# Patient Record
Sex: Female | Born: 1975 | Race: Black or African American | Hispanic: No | Marital: Married | Smoking: Never smoker
Health system: Southern US, Community
[De-identification: ages and names within clinical notes are randomized; demographics above are authoritative.]

## PROBLEM LIST (undated history)

## (undated) ENCOUNTER — Inpatient Hospital Stay (HOSPITAL_COMMUNITY): Payer: Self-pay

## (undated) DIAGNOSIS — Z9884 Bariatric surgery status: Secondary | ICD-10-CM

## (undated) DIAGNOSIS — D649 Anemia, unspecified: Secondary | ICD-10-CM

## (undated) DIAGNOSIS — F419 Anxiety disorder, unspecified: Secondary | ICD-10-CM

## (undated) DIAGNOSIS — B009 Herpesviral infection, unspecified: Secondary | ICD-10-CM

## (undated) DIAGNOSIS — N6009 Solitary cyst of unspecified breast: Secondary | ICD-10-CM

## (undated) DIAGNOSIS — T8859XA Other complications of anesthesia, initial encounter: Secondary | ICD-10-CM

## (undated) DIAGNOSIS — R519 Headache, unspecified: Secondary | ICD-10-CM

## (undated) DIAGNOSIS — I10 Essential (primary) hypertension: Secondary | ICD-10-CM

## (undated) DIAGNOSIS — Z9049 Acquired absence of other specified parts of digestive tract: Secondary | ICD-10-CM

## (undated) HISTORY — DX: Acquired absence of other specified parts of digestive tract: Z90.49

## (undated) HISTORY — PX: BREAST CYST EXCISION: SHX579

## (undated) HISTORY — DX: Essential (primary) hypertension: I10

## (undated) HISTORY — PX: OTHER SURGICAL HISTORY: SHX169

## (undated) HISTORY — DX: Bariatric surgery status: Z98.84

## (undated) HISTORY — DX: Solitary cyst of unspecified breast: N60.09

## (undated) HISTORY — PX: GASTRIC BYPASS: SHX52

## (undated) HISTORY — PX: CHOLECYSTECTOMY: SHX55

## (undated) HISTORY — DX: Herpesviral infection, unspecified: B00.9

## (undated) HISTORY — PX: CYST REMOVAL NECK: SHX6281

---
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
## 1999-12-08 ENCOUNTER — Encounter: Admission: RE | Admit: 1999-12-08 | Discharge: 1999-12-08 | Payer: Self-pay | Admitting: Family Medicine

## 1999-12-08 ENCOUNTER — Encounter: Payer: Self-pay | Admitting: Family Medicine

## 1999-12-21 ENCOUNTER — Ambulatory Visit (HOSPITAL_COMMUNITY): Admission: RE | Admit: 1999-12-21 | Discharge: 1999-12-21 | Payer: Self-pay | Admitting: Obstetrics & Gynecology

## 1999-12-21 ENCOUNTER — Encounter (INDEPENDENT_AMBULATORY_CARE_PROVIDER_SITE_OTHER): Payer: Self-pay

## 2000-12-07 ENCOUNTER — Other Ambulatory Visit: Admission: RE | Admit: 2000-12-07 | Discharge: 2000-12-07 | Payer: Self-pay | Admitting: Obstetrics & Gynecology

## 2002-01-02 ENCOUNTER — Other Ambulatory Visit: Admission: RE | Admit: 2002-01-02 | Discharge: 2002-01-02 | Payer: Self-pay | Admitting: Obstetrics & Gynecology

## 2002-09-01 ENCOUNTER — Other Ambulatory Visit: Admission: RE | Admit: 2002-09-01 | Discharge: 2002-09-01 | Payer: Self-pay | Admitting: Obstetrics & Gynecology

## 2003-02-13 ENCOUNTER — Inpatient Hospital Stay (HOSPITAL_COMMUNITY): Admission: AD | Admit: 2003-02-13 | Discharge: 2003-02-13 | Payer: Self-pay | Admitting: Obstetrics & Gynecology

## 2003-04-09 ENCOUNTER — Ambulatory Visit (HOSPITAL_COMMUNITY): Admission: RE | Admit: 2003-04-09 | Discharge: 2003-04-09 | Payer: Self-pay | Admitting: Obstetrics & Gynecology

## 2003-04-12 ENCOUNTER — Inpatient Hospital Stay (HOSPITAL_COMMUNITY): Admission: AD | Admit: 2003-04-12 | Discharge: 2003-04-15 | Payer: Self-pay | Admitting: Obstetrics & Gynecology

## 2003-04-17 ENCOUNTER — Inpatient Hospital Stay (HOSPITAL_COMMUNITY): Admission: AD | Admit: 2003-04-17 | Discharge: 2003-04-17 | Payer: Self-pay | Admitting: Obstetrics

## 2004-07-05 ENCOUNTER — Emergency Department (HOSPITAL_COMMUNITY): Admission: EM | Admit: 2004-07-05 | Discharge: 2004-07-06 | Payer: Self-pay | Admitting: Emergency Medicine

## 2004-08-21 ENCOUNTER — Emergency Department (HOSPITAL_COMMUNITY): Admission: EM | Admit: 2004-08-21 | Discharge: 2004-08-21 | Payer: Self-pay | Admitting: Emergency Medicine

## 2005-11-06 ENCOUNTER — Encounter: Admission: RE | Admit: 2005-11-06 | Discharge: 2005-11-06 | Payer: Self-pay | Admitting: General Surgery

## 2005-11-06 ENCOUNTER — Ambulatory Visit (HOSPITAL_COMMUNITY): Admission: RE | Admit: 2005-11-06 | Discharge: 2005-11-06 | Payer: Self-pay | Admitting: General Surgery

## 2005-11-07 ENCOUNTER — Ambulatory Visit (HOSPITAL_COMMUNITY): Admission: RE | Admit: 2005-11-07 | Discharge: 2005-11-07 | Payer: Self-pay | Admitting: General Surgery

## 2006-02-16 ENCOUNTER — Encounter: Admission: RE | Admit: 2006-02-16 | Discharge: 2006-05-17 | Payer: Self-pay | Admitting: General Surgery

## 2006-03-01 HISTORY — PX: GASTRIC BYPASS: SHX52

## 2006-03-05 ENCOUNTER — Inpatient Hospital Stay (HOSPITAL_COMMUNITY): Admission: RE | Admit: 2006-03-05 | Discharge: 2006-03-07 | Payer: Self-pay | Admitting: General Surgery

## 2006-05-14 ENCOUNTER — Encounter: Admission: RE | Admit: 2006-05-14 | Discharge: 2006-08-12 | Payer: Self-pay | Admitting: General Surgery

## 2006-08-24 ENCOUNTER — Emergency Department (HOSPITAL_COMMUNITY): Admission: EM | Admit: 2006-08-24 | Discharge: 2006-08-24 | Payer: Self-pay | Admitting: Family Medicine

## 2006-11-03 ENCOUNTER — Emergency Department (HOSPITAL_COMMUNITY): Admission: EM | Admit: 2006-11-03 | Discharge: 2006-11-03 | Payer: Self-pay | Admitting: Family Medicine

## 2007-03-27 ENCOUNTER — Ambulatory Visit (HOSPITAL_COMMUNITY): Admission: RE | Admit: 2007-03-27 | Discharge: 2007-03-27 | Payer: Self-pay | Admitting: Obstetrics & Gynecology

## 2007-05-24 ENCOUNTER — Emergency Department (HOSPITAL_COMMUNITY): Admission: EM | Admit: 2007-05-24 | Discharge: 2007-05-24 | Payer: Self-pay | Admitting: Emergency Medicine

## 2007-12-14 ENCOUNTER — Emergency Department (HOSPITAL_COMMUNITY): Admission: EM | Admit: 2007-12-14 | Discharge: 2007-12-14 | Payer: Self-pay | Admitting: Family Medicine

## 2008-07-07 ENCOUNTER — Emergency Department (HOSPITAL_COMMUNITY): Admission: EM | Admit: 2008-07-07 | Discharge: 2008-07-07 | Payer: Self-pay | Admitting: Emergency Medicine

## 2009-06-28 DIAGNOSIS — O99210 Obesity complicating pregnancy, unspecified trimester: Secondary | ICD-10-CM | POA: Insufficient documentation

## 2010-09-16 NOTE — Op Note (Signed)
Gina Banks, Gina Banks         ACCOUNT NO.:  [id number]   MEDICAL RECORD NO.:  [id number]          PATIENT TYPE:  INP   LOCATION:  0003                         FACILITY:  [facility]   PHYSICIAN:  Sharlet Salina T. Hoxworth, M.D.DATE OF BIRTH:  03-25-1976   DATE OF PROCEDURE:  03/05/2006  DATE OF DISCHARGE:                               OPERATIVE REPORT   PRE AND POSTOPERATIVE DIAGNOSIS:  Morbid obesity.   SURGICAL PROCEDURES:  Laparoscopic Roux-en-Y gastric bypass.   SURGEON:  Lorne Skeens. Hoxworth, M.D.   ASSISTANT:  Dr. Ovidio Kin.   ANESTHESIA:  General.   BRIEF HISTORY:  Gina Banks is a 35 year old female with morbid  obesity unresponsive to medical management presenting with a BMI of 46.  After extensive preoperative discussion and workup detailed elsewhere,  we have elected to proceed with laparoscopic Roux-en-Y gastric bypass.  She is brought to operating room for this procedure.   DESCRIPTION OF PROCEDURE:  Following mechanical antibiotic bowel prep at  home the patient was brought to the operating room and placed supine  position on the operating table and general endotracheal anesthesia was  induced.  She received Lovenox 40 mg subcutaneously.  PAS were placed.  She received broad-spectrum IV antibiotics.  The abdomen was widely  sterilely prepped and draped.  Correct patient procedure were verified.  Abdominal access was obtained through a 1 cm incision in the left  subcostal area with an OptiVu trocar without difficulty.  Pneumoperitoneum established.  Under direct vision 11 mm trocar was  placed in the right subxiphoid space through the falciform ligament in  the right upper mid abdomen, in the left upper abdomen just to the left  of the umbilicus and a 5 mm trocar placed in the left flank.  The camera  switched to the 45 degrees scope.  The omentum was brought up into the  upper abdomen.  The base of the mesocolon identified and the ligament of  Treitz clearly  identified.  A 40-cm biliopancreatic limb was measured  which allowed the mesentery to reach easily up toward the upper stomach.  Small bowel was divided this point with a single firing of the blue load  60 mm stapler.  This also divided the short portion of the mesentery for  mobility.  The end of the Roux limb was marked by suturing a small  Penrose drain to this.  Following this a 100 cm Roux limb was carefully  measured.  At this point a side-to-side anastomosis was created between  the biliopancreatic limb at the end of the side of the Roux limb at 100  cm creating enterotomies with the harmonic scalpel and using a single  firing of blue load 45 mm stapler.  The staple line was inspected and  was intact and without bleeding.  The common enterotomy was closed with  running 200 Vicryl beginning at either end of the enterotomy and tied  centrally.  Following this the defect at the mesentery was closed with a  running 2-0 silk suture and this suture and staple lines were coated  with Tisseel tissue sealant.  Anastomosis appeared widely patent under  no tension.  A Nathanson retractor was placed through a 5 mm subxiphoid  site and the left lobe of liver elevated with its exposure of the entire  stomach.  Angle of His was dissected with harmonic scalpel and  dissection carried down along the left crus toward the retrogastric  space.  A point planned vision along the lesser curve at about 4 cm from  the EG junction was chosen and this was cleared of perigastric mesentery  and with careful blunt dissection the free lesser sac was entered.  All  tubes were confirmed removed from the stomach.  Initial firing the gold  Echelon stapler was performed at right angles to the lesser curve for  about 3.5 to 4 cm.  Two further firings of the blue load stapler were  used to fire up through the angle of his and this created a nice small  tubular gastric pouch about 4 cm in length.  There was no bleeding  from  the staple lines.  Following this, the remnant staple line was oversewn  with running 2-0 silk for hemostasis.  The Roux limb was brought up to  the pouch with the candy cane facing to the left.  Initial posterior row  of running 2-0 Vicryl was placed between the Roux limb and the staple  line of the gastric pouch.  The Penrose drain was removed.  The Ewald  tube was passed into the pouch and using this stent the pouch,  enterotomies were made in the pouch of the Roux limb with the harmonic  scalpel.  After this, approximately 2 cm anastomosis was created between  the pouch and the Roux limb with a single firing the blue load 45 mm  stapler.  Staple line was intact and without bleeding.  The common  enterotomy was then closed with running 2-0 Vicryl beginning either of  the enterotomy and tied centrally.  The Ewald tube was then passed back  down through the anastomosis.  An anterior row of running 2-0 Vicryl was  then placed.  Following this with the outlet of the pouch clamped, Dr.  Ezzard Standing performed endoscopy with pouch tightly distended with air.  There  was no evidence of leak.  Air was suctioned.  Abdomen was irrigated.  Hemostasis assured.  Tisseel tissue sealant was used to coat the sutures  and staple lines of the gastrojejunostomy.  The Nathanson retractor was  removed under direct vision, all CO2 evacuated and trocars removed.  Skin incisions were closed with staples.   Sponge, needle and instrument counts were correct.  Dry sterile  dressings were applied.  The patient taken recovery in good condition.      Lorne Skeens. Hoxworth, M.D.  Electronically Signed     BTH/MEDQ  D:  03/05/2006  T:  03/05/2006  Job:  [id number]

## 2010-09-16 NOTE — Op Note (Signed)
NAMENAYELLIE, SANSEVERINO         ACCOUNT NO.:  [id number]   MEDICAL RECORD NO.:  [id number]          PATIENT TYPE:  INP   LOCATION:  0003                         FACILITY:  [facility]   PHYSICIAN:  Sandria Bales. Ezzard Standing, M.D.  DATE OF BIRTH:  January 17, 1976   DATE OF PROCEDURE:  DATE OF DISCHARGE:                                 OPERATIVE REPORT   PREOPERATIVE DIAGNOSIS:  Morbid obesity with a BMI about 45.   POSTOPERATIVE DIAGNOSIS:  Morbid obesity, status post roux-en-Y gastric  bypass.   PROCEDURE PERFORMED:  Esophagogastrojejunoscopy.   ASSISTANT:  None.   ANESTHESIA:  General endotracheal.   ESTIMATED BLOOD LOSS:  None.   INDICATIONS FOR PROCEDURE:  Ms. Wolven is a 35 year old female who has  undergone a laparoscopic Roux-en-Y gastric bypass by Dr. Jaclynn Guarneri.  While he is manning the laparoscope, I am doing upper endoscopy for  documentation of pouch size and making sure there is no leak.   PROCEDURE NOTE:  The patient is in a mildly reverse Trendelenburg position.  A flexible  Olympus endoscope was passed down the back of the throat into her stomach  pouch without difficulty.  Her gastrojejunal anastomosis was visualized at  45 cm.  Her esophagogastric junction was visualized at 40 cm for about a 4.5  cm pouch. The mucosa of the pouch looked good.  There was no bleeding.  Her  anastomosis was widely patent.   Dr. Johna Sheriff has clamped off the jejunum while in insufflated the stomach  with air and flooded the upper abdomen.  There is no evidence of any  bubbling or air leak.   This is felt to be a normal post-anastomosis endoscope.  The scope was  withdrawn into the esophagus which is unremarkable.  The patient tolerated  the procedure well.  Dr. Johna Sheriff will dictate the roux-en-Y gastric bypass.      Sandria Bales. Ezzard Standing, M.D.  Electronically Signed     DHN/MEDQ  D:  03/05/2006  T:  03/05/2006  Job:  [id number]   cc:   Lorne Skeens. Hoxworth, M.D.  [address],  [address]  [place]  [place] [address]

## 2010-09-16 NOTE — Op Note (Signed)
[facility]  Patient:    Gina, Banks                MRN: [id number] Proc. Date: 12/21/99 Adm. Date:  [id number] Attending:  Minette Headland                           Operative Report  PREOPERATIVE DIAGNOSES:       Menorrhagia, cyclic pelvic pain.  POSTOPERATIVE DIAGNOSES:      Normal uterine cavity except for size of uterus, uterine enlargement most consistent with adenomyosis, no other pelvic abnormalities or obvious intra-abdominal abnormalities.  CLINICAL FINDINGS:            Slight enlargement of uterus with tenderness of the uterus, suspected adenomyosis and/or endometriosis.  OPERATION PERFORMED:          Hysteroscopy, dilation and curettage performed by laparoscopy with uterosacral nerve oblation.  ESTIMATED BLOOD LOSS:         20 cc.  SORBITOL DEFICIT FROM HYSTEROSCOPY:                 50 cc.  INTRAOPERATIVE COMPLICATIONS: None.  INTRAOPERATIVE FINDINGS:      Recorded in still photographs which are retained in the office record.  HISTORY OF PRESENT ILLNESS:   The patient is a 35 year old black single female nulligravida who was kindly referred by her general medical doctor for GYN evaluation following her presentation to Dr. Joyce Gross office in early August of this year with abdominal pain. Pelvic ultrasound obtained by Dr. Lisabeth Pick showed an increased echogenicity anteriorly within the myometrium of the mid uterus which was consistent with adenomyosis. The ovaries and other pelvic findings were normal.  Intraoperative findings today were uterine enlargement. The uterus sounded to 10 cm. There were no intracavitary abnormalities. At laparoscopy, the uterus was enlarged and slightly boggy in appearance and slightly irregular, however, the general contour was normal. The uterus was enlarged the 1.5 to 2 times normal. No other abnormalities were noted within the pelvis or abdomen including the appendix.  DESCRIPTION OF  PROCEDURE:     The patient was admitted on the morning of surgery. She was given a gram of Cefotan IV preoperatively. She was placed in PAS hose. She was brought to the operating room, placed under adequate general endotracheal anesthesia, placed in dorsal lithotomy position using the Orange stirrup system. Betadine prep of the abdomen, perineum and vagina was carried out in the usual fashion. The bladder was evacuated with a Robinson catheter. The cervix was visualized using a bivalve speculum. The cervix was grasped over the anterior lip with a single tooth tenaculum. The uterus sounded to 10 cm.  Progressive dilatation of the cervix was carried out with Shawnie Pons dilators to 23. The ACMI 12 degree hysteroscope was introduced using sorbitol as a distending medium and examination revealed an enlargement, otherwise normal endometrial cavity. Photos were made and retained in the records. Gentle cervical curettage and exploration with Randall stone forceps was accomplished to sample the endometrium. The Hulka tenaculum was then attached to the cervix. Sterile drapes were applied to the abdomen. Two incisions were made, 1 at the umbilicus and 1 just above the symphysis. A 10 mm trocar was introduced to the upper incision while elevating the anterior abdominal wall manually. Direct inspection revealed the trocar to have passed through the omentum but no other structures were injured and there was no bleeding from the omentum. A 5 mm trocar was  placed in the lower incision under direct vision after a pneumoperitoneum was allowed to accumulated with carbon dioxide gas. Systematic examination of abdominal and pelvic contents plus photographs were taken. Using the bipolar forceps through the operating channel of the laparoscope, the uterosacrals were fulgurated at their junction with the cervix. The procedure at this point was terminated. Gas was allowed to escape from the abdomen. Hemostasis was  complete. The incisions were closed in interrupted subcuticular sutures of 3-0 Dexon. Approximately 10 cc of 0.5% plain Marcaine was used to inject into the incision sites for postoperative analgesia. The patient was awakened and taken to recovery in good condition. She will be discharged in the immediate postop period for follow-up in the office in 2 weeks. She is given Vicodin to be taken as needed for postoperative pain. She will be given routine outpatient surgical instructions. DD:  12/21/99 TD:  12/21/99 Job: [id number]

## 2011-01-19 LAB — DIFFERENTIAL
Basophils Absolute: 0.1
Lymphocytes Relative: 40
Monocytes Relative: 16 — ABNORMAL HIGH
Neutrophils Relative %: 39 — ABNORMAL LOW

## 2011-01-19 LAB — CBC
Hemoglobin: 11 — ABNORMAL LOW
Platelets: 256
RBC: 3.89
RDW: 14.2
WBC: 5.4

## 2011-01-19 LAB — COMPREHENSIVE METABOLIC PANEL
BUN: 4 — ABNORMAL LOW
CO2: 27
Calcium: 9.4
Chloride: 108
Creatinine, Ser: 0.79
Glucose, Bld: 91
Potassium: 4.1

## 2011-02-14 LAB — WET PREP, GENITAL: Trich, Wet Prep: NONE SEEN

## 2011-02-14 LAB — POCT URINALYSIS DIP (DEVICE)
Glucose, UA: NEGATIVE
Nitrite: NEGATIVE
Operator id: 270961
Specific Gravity, Urine: 1.02
pH: 6

## 2011-02-14 LAB — GC/CHLAMYDIA PROBE AMP, GENITAL
Chlamydia, DNA Probe: NEGATIVE
GC Probe Amp, Genital: NEGATIVE

## 2011-02-14 LAB — POCT PREGNANCY, URINE: Preg Test, Ur: NEGATIVE

## 2011-05-16 ENCOUNTER — Emergency Department (INDEPENDENT_AMBULATORY_CARE_PROVIDER_SITE_OTHER)
Admission: EM | Admit: 2011-05-16 | Discharge: 2011-05-16 | Disposition: A | Discharge status: Home / Self Care | Payer: BC Managed Care – PPO | Source: Home / Self Care | Attending: Family Medicine | Admitting: Family Medicine

## 2011-05-16 ENCOUNTER — Encounter (HOSPITAL_COMMUNITY): Payer: Self-pay | Admitting: Emergency Medicine

## 2011-05-16 DIAGNOSIS — I10 Essential (primary) hypertension: Secondary | ICD-10-CM

## 2011-05-16 MED ORDER — HYDROCHLOROTHIAZIDE 25 MG PO TABS: 25.0000 mg | ORAL_TABLET | Freq: Every day | ORAL | Status: DC
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
## 2011-05-16 NOTE — ED Provider Notes (Signed)
Medical screening examination/treatment/procedure(s) were performed by non-physician practitioner and as supervising physician I was immediately available for consultation/collaboration.  Corrie Mckusick, MD 05/16/11 2119

## 2012-09-02 ENCOUNTER — Ambulatory Visit (INDEPENDENT_AMBULATORY_CARE_PROVIDER_SITE_OTHER): Payer: BC Managed Care – PPO | Admitting: *Deleted

## 2012-09-02 VITALS — BP 123/87 | HR 80 | Temp 98.4°F | Ht 68.0 in | Wt 260.0 lb

## 2012-09-02 DIAGNOSIS — Z309 Encounter for contraceptive management, unspecified: Secondary | ICD-10-CM

## 2012-09-02 DIAGNOSIS — IMO0001 Reserved for inherently not codable concepts without codable children: Secondary | ICD-10-CM

## 2012-09-02 MED ORDER — MEDROXYPROGESTERONE ACETATE 150 MG/ML IM SUSP
150.0000 mg | INTRAMUSCULAR | Status: AC
  Administered 2012-09-02 – 2013-05-02 (×3): 150 mg via INTRAMUSCULAR

## 2012-09-02 NOTE — Progress Notes (Signed)
Patient was here today for her depo injection.  Medroxyprogesterone 150mg /ml 1ml IM to her right Deltoid.  [id number]  Exp- 05/2015.  Patient tolerated well.  RTO for next visit. 11/24/12.

## 2012-11-18 ENCOUNTER — Encounter: Payer: Self-pay | Admitting: Obstetrics & Gynecology

## 2012-11-19 ENCOUNTER — Ambulatory Visit (INDEPENDENT_AMBULATORY_CARE_PROVIDER_SITE_OTHER): Payer: BC Managed Care – PPO | Admitting: *Deleted

## 2012-11-19 VITALS — BP 123/82 | HR 99 | Wt 267.0 lb

## 2012-11-19 DIAGNOSIS — IMO0001 Reserved for inherently not codable concepts without codable children: Secondary | ICD-10-CM

## 2012-11-19 DIAGNOSIS — Z3049 Encounter for surveillance of other contraceptives: Secondary | ICD-10-CM

## 2012-11-19 NOTE — Progress Notes (Signed)
Pt in office for Depo injection. Pt is on time for her injection. Pt due back in the office for next injection is February 08, 2013.

## 2013-02-10 ENCOUNTER — Ambulatory Visit: Payer: BC Managed Care – PPO

## 2013-02-12 ENCOUNTER — Ambulatory Visit (INDEPENDENT_AMBULATORY_CARE_PROVIDER_SITE_OTHER): Payer: BC Managed Care – PPO | Admitting: *Deleted

## 2013-02-12 VITALS — BP 127/92 | HR 82 | Temp 98.2°F | Ht 67.5 in | Wt 269.6 lb

## 2013-02-12 DIAGNOSIS — IMO0001 Reserved for inherently not codable concepts without codable children: Secondary | ICD-10-CM

## 2013-02-12 DIAGNOSIS — Z309 Encounter for contraceptive management, unspecified: Secondary | ICD-10-CM

## 2013-02-12 NOTE — Progress Notes (Signed)
Patient is here today for her depo injection.  Patient tolerated well.  RTO 05/06/12 for next visit.

## 2013-03-31 ENCOUNTER — Ambulatory Visit (INDEPENDENT_AMBULATORY_CARE_PROVIDER_SITE_OTHER): Payer: BC Managed Care – PPO | Admitting: Podiatry

## 2013-03-31 ENCOUNTER — Encounter: Payer: Self-pay | Admitting: Podiatry

## 2013-03-31 VITALS — BP 126/78 | HR 81 | Resp 20 | Ht 67.5 in | Wt 260.0 lb

## 2013-03-31 DIAGNOSIS — Q828 Other specified congenital malformations of skin: Secondary | ICD-10-CM

## 2013-03-31 NOTE — Progress Notes (Signed)
Patient ID: Gina Banks, female   DOB: 03/01/1976, 37 y.o.   MRN: [id number]  Subjective: Patient presents complaining of painful keratoses on the plantar right heel. The lesion on the MPJ right seems to have cleared from previous debridement. She has been a patient of practice since April 2013 at last visit in our office was 06/12/2012.  Objective: Nucleated keratoses plantar right heel x1  Assessment: Porokeratoses x1, right heel  Plan: Debridement of keratoses and packed with salinocaine. Reappoint at patient's request.

## 2013-05-02 ENCOUNTER — Ambulatory Visit (INDEPENDENT_AMBULATORY_CARE_PROVIDER_SITE_OTHER): Payer: BC Managed Care – PPO | Admitting: *Deleted

## 2013-05-02 VITALS — BP 115/86 | HR 130 | Temp 98.1°F | Ht 67.5 in | Wt 264.0 lb

## 2013-05-02 DIAGNOSIS — Z309 Encounter for contraceptive management, unspecified: Secondary | ICD-10-CM

## 2013-05-02 DIAGNOSIS — IMO0001 Reserved for inherently not codable concepts without codable children: Secondary | ICD-10-CM

## 2013-05-02 NOTE — Progress Notes (Signed)
Pt in office today for depo injection 

## 2013-05-06 ENCOUNTER — Ambulatory Visit: Payer: BC Managed Care – PPO

## 2013-05-29 ENCOUNTER — Encounter: Payer: Self-pay | Admitting: Obstetrics & Gynecology

## 2013-05-29 ENCOUNTER — Ambulatory Visit (INDEPENDENT_AMBULATORY_CARE_PROVIDER_SITE_OTHER): Payer: BC Managed Care – PPO | Admitting: Obstetrics & Gynecology

## 2013-05-29 VITALS — BP 136/85 | HR 101 | Temp 100.0°F | Ht 67.5 in

## 2013-05-29 DIAGNOSIS — Z3169 Encounter for other general counseling and advice on procreation: Secondary | ICD-10-CM

## 2013-05-29 DIAGNOSIS — Z01419 Encounter for gynecological examination (general) (routine) without abnormal findings: Secondary | ICD-10-CM

## 2013-05-29 NOTE — Progress Notes (Signed)
Subjective:     Gina Banks is a 38 y.o. female here for a routine exam.  Current complaints: pt in office to discuss conception and possible exam.  Personal health questionnaire reviewed: yes.   Gynecologic History No LMP recorded. Patient has had an injection. Contraception: Depo-Provera injections  Last mammogram: n/a  Obstetric History OB History  No data available     The following portions of the patient's history were reviewed and updated as appropriate: allergies, current medications, past family history, past medical history, past social history, past surgical history and problem list.  Review of Systems Pertinent items are noted in HPI.    Objective:      General appearance: alert Breasts: normal appearance, no masses or tenderness Abdomen: soft, non-tender; bowel sounds normal; no masses,  no organomegaly Pelvic: cervix normal in appearance, external genitalia normal, no adnexal masses or tenderness, uterus normal size, shape, and consistency and vagina normal without discharge    Assessment:    Healthy female exam.    Plan:    Follow up in: 6 months.   Family Practice Referral  Preconception information given Basal Temp charts given

## 2013-05-30 LAB — HIV ANTIBODY (ROUTINE TESTING W REFLEX): HIV: NONREACTIVE

## 2013-05-30 LAB — VITAMIN D 25 HYDROXY (VIT D DEFICIENCY, FRACTURES): Vit D, 25-Hydroxy: 18 ng/mL — ABNORMAL LOW (ref 30–89)

## 2013-05-31 LAB — OBSTETRIC PANEL
ANTIBODY SCREEN: NEGATIVE
Basophils Absolute: 0.1 10*3/uL (ref 0.0–0.1)
Basophils Relative: 1 % (ref 0–1)
Eosinophils Absolute: 0.1 10*3/uL (ref 0.0–0.7)
Eosinophils Relative: 2 % (ref 0–5)
HCT: 37.6 % (ref 36.0–46.0)
Hemoglobin: 11.8 g/dL — ABNORMAL LOW (ref 12.0–15.0)
Hepatitis B Surface Ag: NEGATIVE
LYMPHS ABS: 2.4 10*3/uL (ref 0.7–4.0)
Lymphocytes Relative: 50 % — ABNORMAL HIGH (ref 12–46)
MCH: 26.8 pg (ref 26.0–34.0)
MCHC: 31.4 g/dL (ref 30.0–36.0)
MCV: 85.5 fL (ref 78.0–100.0)
MONOS PCT: 10 % (ref 3–12)
Monocytes Absolute: 0.5 10*3/uL (ref 0.1–1.0)
NEUTROS PCT: 37 % — AB (ref 43–77)
Neutro Abs: 1.9 10*3/uL (ref 1.7–7.7)
PLATELETS: 331 10*3/uL (ref 150–400)
RBC: 4.4 MIL/uL (ref 3.87–5.11)
RDW: 14.3 % (ref 11.5–15.5)
RH TYPE: POSITIVE
RUBELLA: 10.2 {index} — AB (ref <0.90)
WBC: 4.9 10*3/uL (ref 4.0–10.5)

## 2013-05-31 LAB — VARICELLA ZOSTER ANTIBODY, IGG: VARICELLA IGG: 1797 {index} — AB (ref <135.00)

## 2013-06-01 NOTE — Patient Instructions (Signed)
Preparing for Pregnancy Preparing for pregnancy (preconceptual care) by getting counseling and information from your caregiver before getting pregnant is a good idea. It will help you and your baby have a better chance to have a healthy, safe pregnancy and delivery of your baby. Make an appointment with your caregiver to talk about your health, medical, and family history and how to prepare yourself before getting pregnant. Your caregiver will do a complete physical exam and a Pap test. They will want to know:  About you, your spouse or partner, and your family's medical and genetic history.  If you are eating a balanced diet and drinking enough fluids.  What vitamins and mineral supplements you are taking. This includes taking folic acid before getting pregnant to help prevent birth defects.  What medications you are taking including prescription, over-the-counter and herbal medications.  If there is any substance abuse like alcohol, smoking, and illegal drugs.  If there is any mental or physical domestic violence.  If there is any risk of sexually transmitted disease between you and your partner.  What immunizations and vaccinations you have had and what you may need before getting pregnant.  If you should get tested for HIV infection.  If there is any exposure to chemical or toxic substances at home or work.  If there are medical problems you have that need to be treated and kept under control before getting pregnant such as diabetes, high blood pressure or others.  If there were any past surgeries, pregnancies and problems with them.  What your current weight is and to set a goal as to how much weight you should gain while pregnant. Also, they will check if you should lose or gain weight before getting pregnant.  What is your exercise routine and what it is safe when you are pregnant.  If there are any physical disabilities that need to be addressed.  About spacing your  pregnancies when there are other children.  If there is a financial problem that may affect you having a child. After talking about the above points with your caregiver, your caregiver will give you advice on how to help treat and work with you on solving any issues, if necessary, before getting pregnant. The goal is to have a healthy and safe pregnancy for you and your baby. You should keep an accurate record of your menstrual periods because it will help in determining your due date. Immunizations that you should have before getting pregnant:   Regular measles, German measles (rubella) and mumps.  Tetanus and diphtheria.  Chickenpox, if not immune.  Herpes zoster (Varicella) if not immune.  Human papilloma virus vaccine (HPV) between the age of 9 and 26 years old.  Hepatitis A vaccine.  Hepatitis B vaccine.  Influenza vaccine.  Pneumococcal vaccine (pneumonia). You should avoid getting pregnant for one month after getting vaccinated with a live virus vaccine such as German measles (rubella) which is in the MMR (Measles, Mumps and Rubella) vaccine. Other immunizations may be necessary depending on where you live, such as malaria. Ask your caregiver if any other immunizations are needed for you. HOME CARE INSTRUCTIONS   Follow the advice of your caregiver.  Before getting pregnant:  Begin taking vitamins, supplements, and 0.4 milligrams folic acid daily.  Get your immunizations up-to-date.  Get help from a nutrition counselor if you do not understand what a balanced diet is, need help with a special medical diet or if you need help to lose or gain weight.    Begin exercising.  Stop smoking, taking illegal drugs, and drinking alcoholic beverages.  Get counseling if there is and type of domestic violence.  Get checked for sexually transmitted diseases including HIV.  Get any medical problems under control (diabetes, high blood pressure, convulsions, asthma or  others).  Resolve any financial concerns or create a plan to do so.  Be sure you and your spouse or partner are ready to have a baby.  Keep an accurate record of your menstrual periods. Document Released: 03/30/2008 Document Revised: 02/05/2013 Document Reviewed: 03/30/2008 [facility] Patient Information 2014 [place].

## 2013-06-02 LAB — HEMOGLOBINOPATHY EVALUATION
HEMOGLOBIN OTHER: 0 %
HGB S QUANTITAION: 0 %
Hgb A2 Quant: 2.6 % (ref 2.2–3.2)
Hgb A: 97.1 % (ref 96.8–97.8)
Hgb F Quant: 0.3 % (ref 0.0–2.0)

## 2013-06-02 LAB — PAP IG AND HPV HIGH-RISK: HPV DNA HIGH RISK: NOT DETECTED

## 2013-06-07 ENCOUNTER — Encounter: Payer: Self-pay | Admitting: Obstetrics & Gynecology

## 2013-06-07 DIAGNOSIS — E559 Vitamin D deficiency, unspecified: Secondary | ICD-10-CM | POA: Insufficient documentation

## 2013-06-30 ENCOUNTER — Ambulatory Visit: Payer: BC Managed Care – PPO

## 2013-07-09 ENCOUNTER — Other Ambulatory Visit: Payer: Self-pay | Admitting: *Deleted

## 2013-07-09 ENCOUNTER — Encounter: Payer: Self-pay | Admitting: Obstetrics & Gynecology

## 2013-07-09 DIAGNOSIS — E559 Vitamin D deficiency, unspecified: Secondary | ICD-10-CM

## 2013-07-09 MED ORDER — ERGOCALCIFEROL 1.25 MG (50000 UT) PO CAPS: 50000.0000 [IU] | ORAL_CAPSULE | ORAL | Status: DC

## 2013-11-26 ENCOUNTER — Encounter: Payer: Self-pay | Admitting: Obstetrics & Gynecology

## 2013-11-26 ENCOUNTER — Ambulatory Visit (INDEPENDENT_AMBULATORY_CARE_PROVIDER_SITE_OTHER): Payer: BC Managed Care – PPO | Admitting: Obstetrics & Gynecology

## 2013-11-26 VITALS — BP 140/80 | HR 82 | Temp 97.9°F | Ht 67.5 in | Wt 270.0 lb

## 2013-11-26 DIAGNOSIS — Z3169 Encounter for other general counseling and advice on procreation: Secondary | ICD-10-CM

## 2013-11-29 LAB — ANTI MULLERIAN HORMONE: AMH AssessR: 4.44 ng/mL

## 2013-12-02 NOTE — Progress Notes (Signed)
Patient ID: Gina ChangKimberly T Comrie, female   DOB: 10-03-1975, 38 y.o.   MRN: [id number]  Chief Complaint  Patient presents with  . Follow-up    Presents for preconception counseling     HPI Gina Banks is a 38 y.o. female.  Reports regular menses.  HPI  Past Medical History  Diagnosis Date  . Menopause present   . Gastric bypass status for obesity   . Cyst of breast   . Hx of cholecystectomy   . Hypertension     Past Surgical History  Procedure Laterality Date  . Cholecystectomy    . Gastric bypass    . Breast cyst excision    . Fallopian tube cyst    . Cyst removal neck Right     Family History  Problem Relation Age of Onset  . Heart attack Paternal Grandfather   . Diabetes Father   . Hypertension Father   . Cancer Mother     Social History History  Substance Use Topics  . Smoking status: Never Smoker   . Smokeless tobacco: Never Used  . Alcohol Use: 2.5 oz/week    5 drink(s) per week    No Known Allergies  Current Outpatient Prescriptions  Medication Sig Dispense Refill  . ergocalciferol (VITAMIN D2) 50000 UNITS capsule Take 1 capsule (50,000 Units total) by mouth once a week.  4 capsule  1   No current facility-administered medications for this visit.    Review of Systems Review of Systems Constitutional: negative for fatigue and weight loss Respiratory: negative for cough and wheezing Cardiovascular: negative for chest pain, fatigue and palpitations Gastrointestinal: negative for abdominal pain and change in bowel habits Genitourinary:negative for abnormal vaginal discharge Integument/breast: negative for nipple discharge Musculoskeletal:negative for myalgias Neurological: negative for gait problems and tremors Behavioral/Psych: negative for abusive relationship, depression Endocrine: negative for temperature intolerance     Blood pressure 140/80, pulse 82, temperature 97.9 F (36.6 C), height 5' 7.5" (1.715 m), weight 122.471 kg (270  lb), last menstrual period 11/13/2013.  Physical Exam Physical Exam   50% of 15 min visit spent on counseling and coordination of care.   Data Reviewed None  Assessment    Attempting to conceive in the setting of recent Depo provera    Plan    Orders Placed This Encounter  Procedures  . Anti mullerian hormone  . POCT urine pregnancy    Possible management options include: continued efforts to conceive and loose weight;  folic acid supplement Follow up as needed.         JACKSON-MOORE,Dawnn Nam A 12/02/2013, 12:16 PM

## 2013-12-02 NOTE — Patient Instructions (Signed)
Preparing for Pregnancy Before trying to become pregnant, make an appointment with your health care provider (preconception care). The goal is to help you have a healthy, safe pregnancy. At your first appointment, your health care provider will:   Do a complete physical exam, including a Pap test.  Take a complete medical history.  Give you advice and help you resolve any problems. PRECONCEPTION CHECKLIST Here is a list of the basics to cover with your health care provider at your preconception visit:  Medical history.  Tell your health care provider about any diseases you have had. Many diseases can affect your pregnancy.  Include your partner's medical history and family history.  Make sure you have been tested for sexually transmitted infections (STIs). These can affect your pregnancy. In some cases, they can be passed to your baby. Tell your health care provider about any history of STIs.  Make sure your health care provider knows about any previous problems you have had with conception or pregnancy.  Tell your health care provider about any medicine you take. This includes herbal supplements and over-the-counter medicines.  Make sure all your immunizations are up to date. You may need to make additional appointments.  Ask your health care provider if you need any vaccinations or if there are any you should avoid.  Diet.  It is especially important to eat a healthy, balanced diet with the right nutrients when you are pregnant.  Ask your health care provider to help you get to a healthy weight before pregnancy.  If you are overweight, you are at higher risk for certain complications. These include high blood pressure, diabetes, and preterm birth.  If you are underweight, you are more likely to have a low-birth-weight baby.  Lifestyle.  Tell your health care provider about lifestyle factors such as alcohol use, drug use, or smoking.  Describe any harmful substances you may  be exposed to at work or home. These can include chemicals, pesticides, and radiation.  Mental health.  Let your health care provider know if you have been feeling depressed or anxious.  Let your health care provider know if you have a history of substance abuse.  Let your health care provider know if you do not feel safe at home. HOME INSTRUCTIONS TO PREPARE FOR PREGNANCY Follow your health care provider's advice and instructions.   Keep an accurate record of your menstrual periods. This makes it easier for your health care provider to determine your baby's due date.  Begin taking prenatal vitamins and folic acid supplements daily. Take them as directed by your health care provider.  Eat a balanced diet. Get help from a nutrition counselor if you have questions or need help.  Get regular exercise. Try to be active for at least 30 minutes a day most days of the week.  Quit smoking, if you smoke.  Do not drink alcohol.  Do not take illegal drugs.  Get medical problems, such as diabetes or high blood pressure, under control.  If you have diabetes, make sure you do the following:  Have good blood sugar control. If you have type 1 diabetes, use multiple daily doses of insulin. Do not use split-dose or premixed insulin.  Have an eye exam by a qualified eye care professional trained in caring for people with diabetes.  Get evaluated by your health care provider for cardiovascular disease.  Get to a healthy weight. If you are overweight or obese, reduce your weight with the help of a qualified health   professional such as a registered dietitian. Ask your health care provider what the right weight range is for you. HOW DO I KNOW I AM PREGNANT? You may be pregnant if you have been sexually active and you miss your period. Symptoms of early pregnancy include:   Mild cramping.  Very light vaginal bleeding (spotting).  Feeling unusually tired.  Morning sickness. If you have any of  these symptoms, take a home pregnancy test. These tests look for a hormone called human chorionic gonadotropin (hCG) in your urine. Your body begins to make this hormone during early pregnancy. These tests are very accurate. Wait until at least the first day you miss your period to take one. If you get a positive result, call your health care provider to make appointments for prenatal care. WHAT SHOULD I DO IF I BECOME PREGNANT?  Make an appointment with your health care provider by week 12 of your pregnancy at the latest.  Do not smoke. Smoking can be harmful to your baby.  Do not drink alcoholic beverages. Alcohol is related to a number of birth defects.  Avoid toxic odors and chemicals.  You may continue to have sexual intercourse if it does not cause pain or other problems, such as vaginal bleeding. Document Released: 03/30/2008 Document Revised: 09/01/2013 Document Reviewed: 03/24/2013 Exit[employer] Patient Information 2015 [employer]. This information is not intended to replace advice given to you by your health care provider. Make sure you discuss any questions you have with your health care provider.  

## 2014-01-16 ENCOUNTER — Telehealth: Payer: Self-pay

## 2014-01-16 NOTE — Telephone Encounter (Signed)
[employer] NOT ACTIVE - CALLED PATIENT 01/16/14 - DID NOT VERIFY ON BLUE E, EITHER - WILL NEED UPDATED INS INFO BEFORE APPT OR WILL NEED TO RESCHEDULE.

## 2014-01-22 ENCOUNTER — Other Ambulatory Visit: Payer: Self-pay | Admitting: Obstetrics & Gynecology

## 2014-01-22 ENCOUNTER — Ambulatory Visit (HOSPITAL_COMMUNITY)
Admission: RE | Admit: 2014-01-22 | Discharge: 2014-01-22 | Disposition: A | Discharge status: Home / Self Care | Payer: Self-pay | Source: Ambulatory Visit | Attending: Obstetrics & Gynecology | Admitting: Obstetrics & Gynecology

## 2014-01-22 ENCOUNTER — Ambulatory Visit: Payer: BC Managed Care – PPO | Admitting: Obstetrics & Gynecology

## 2014-01-22 ENCOUNTER — Encounter: Payer: Self-pay | Admitting: Obstetrics & Gynecology

## 2014-01-22 VITALS — BP 132/85 | HR 90 | Temp 98.2°F | Wt 264.0 lb

## 2014-01-22 DIAGNOSIS — Z3481 Encounter for supervision of other normal pregnancy, first trimester: Secondary | ICD-10-CM

## 2014-01-22 DIAGNOSIS — O3680X0 Pregnancy with inconclusive fetal viability, not applicable or unspecified: Secondary | ICD-10-CM | POA: Insufficient documentation

## 2014-01-22 DIAGNOSIS — O09521 Supervision of elderly multigravida, first trimester: Secondary | ICD-10-CM

## 2014-01-22 DIAGNOSIS — O99211 Obesity complicating pregnancy, first trimester: Secondary | ICD-10-CM

## 2014-01-22 DIAGNOSIS — O3680X1 Pregnancy with inconclusive fetal viability, fetus 1: Secondary | ICD-10-CM

## 2014-01-22 DIAGNOSIS — O09529 Supervision of elderly multigravida, unspecified trimester: Secondary | ICD-10-CM | POA: Insufficient documentation

## 2014-01-22 DIAGNOSIS — Z3689 Encounter for other specified antenatal screening: Secondary | ICD-10-CM | POA: Insufficient documentation

## 2014-01-22 NOTE — Progress Notes (Unsigned)
Subjective:    Gina Banks is being seen today for her first obstetrical visit.  This {is/is not:9024} a planned pregnancy. She is at [redacted]w[redacted]d gestation. Her obstetrical history is significant for {ob risk factors:10154}. Relationship with FOB: {fob:16621}. Patient {does/does not:19097} intend to breast feed. Pregnancy history fully reviewed.  The information documented in the HPI was reviewed and verified.  Menstrual History: OB History   Grav Para Term Preterm Abortions TAB SAB Ect Mult Living   Menarche age: *** Patient's last menstrual period was 11/13/2013.    Past Medical History  Diagnosis Date  . Menopause present   . Gastric bypass status for obesity   . Cyst of breast   . Hx of cholecystectomy   . Hypertension     Past Surgical History  Procedure Laterality Date  . Cholecystectomy    . Gastric bypass    . Breast cyst excision    . Fallopian tube cyst    . Cyst removal neck Right      (Not in a hospital admission) No Known Allergies  History  Substance Use Topics  . Smoking status: Never Smoker   . Smokeless tobacco: Never Used  . Alcohol Use: No    Family History  Problem Relation Age of Onset  . Heart attack Paternal Grandfather   . Diabetes Father   . Hypertension Father   . Cancer Mother      Review of Systems Constitutional: negative for weight loss Gastrointestinal: negative for vomiting Genitourinary:negative for genital lesions and vaginal discharge and dysuria Musculoskeletal:negative for back pain Behavioral/Psych: negative for abusive relationship, depression, illegal drug usage and tobacco use    Objective:    BP 132/85  Pulse 90  Temp(Src) 98.2 F (36.8 C)  Wt 119.75 kg (264 lb)  LMP 11/13/2013 General Appearance:    Alert, cooperative, no distress, appears stated age  Head:    Normocephalic, without obvious abnormality, atraumatic  Eyes:    PERRL, conjunctiva/corneas clear, EOM's intact, fundi   benign, both eyes  Ears:    Normal TM's and external ear canals, both ears  Nose:   Nares normal, septum midline, mucosa normal, no drainage    or sinus tenderness  Throat:   Lips, mucosa, and tongue normal; teeth and gums normal  Neck:   Supple, symmetrical, trachea midline, no adenopathy;    thyroid:  no enlargement/tenderness/nodules; no carotid   bruit or JVD  Back:     Symmetric, no curvature, ROM normal, no CVA tenderness  Lungs:     Clear to auscultation bilaterally, respirations unlabored  Chest Wall:    No tenderness or deformity   Heart:    Regular rate and rhythm, S1 and S2 normal, no murmur, rub   or gallop  Breast Exam:    No tenderness, masses, or nipple abnormality  Abdomen:     Soft, non-tender, bowel sounds active all four quadrants,    no masses, no organomegaly  Genitalia:    Normal female without lesion, discharge or tenderness  Extremities:   Extremities normal, atraumatic, no cyanosis or edema  Pulses:   2+ and symmetric all extremities  Skin:   Skin color, texture, turgor normal, no rashes or lesions  Lymph nodes:   Cervical, supraclavicular, and axillary nodes normal  Neurologic:   CNII-XII intact, normal strength, sensation and reflexes    throughout      Lab Review Urine pregnancy test  Labs reviewed {YES NO:22349} Radiologic studies reviewed {YES NO:22349} Assessment:    Pregnancy at [redacted]w[redacted]d weeks    Plan:      Prenatal vitamins.  Counseling provided regarding continued use of seat belts, cessation of alcohol consumption, smoking or use of illicit drugs; infection precautions i.e., influenza/TDAP immunizations, toxoplasmosis,CMV, parvovirus, listeria and varicella; workplace safety, exercise during pregnancy; routine dental care, safe medications, sexual activity, hot tubs, saunas, pools, travel, caffeine use, fish and methlymercury, potential toxins, hair treatments, varicose veins Weight gain recommendations per IOM guidelines reviewed: underweight/BMI<  18.5--> gain 28 - 40 lbs; normal weight/BMI 18.5 - 24.9--> gain 25 - 35 lbs; overweight/BMI 25 - 29.9--> gain 15 - 25 lbs; obese/BMI >30->gain  11 - 20 lbs Problem list reviewed and updated. FIRST/CF mutation testing/NIPT/QUAD SCREEN/fragile X/Ashkenazi Jewish population testing/Spinal muscular atrophy discussed: {requests/ordered/declines:14581}. Role of ultrasound in pregnancy discussed; fetal survey: {requests/ordered/declines:14581}. Amniocentesis discussed: {amniocentesis:14582}. VBAC calculator score: VBAC consent form provided Meds ordered this encounter  Medications  . Prenatal Vit-Fe Fumarate-FA (PRENATAL MULTIVITAMIN) TABS tablet    Sig: Take 1 tablet by mouth daily at 12 noon.   Orders Placed This Encounter  Procedures  . Culture, OB Urine  . GC/Chlamydia Probe Amp  . Obstetric panel  . HIV antibody  . Hemoglobinopathy evaluation  . Vit D  25 hydroxy (rtn osteoporosis monitoring)  . Varicella zoster antibody, IgG    Follow up in {numbers 0-4:31231} weeks. ***% of *** min visit spent on counseling and coordination of care.

## 2014-01-23 ENCOUNTER — Ambulatory Visit (INDEPENDENT_AMBULATORY_CARE_PROVIDER_SITE_OTHER): Payer: BC Managed Care – PPO | Admitting: Obstetrics & Gynecology

## 2014-01-23 ENCOUNTER — Encounter: Payer: Self-pay | Admitting: Obstetrics & Gynecology

## 2014-01-23 VITALS — BP 130/86 | HR 95 | Temp 97.5°F | Wt 264.0 lb

## 2014-01-23 DIAGNOSIS — O021 Missed abortion: Secondary | ICD-10-CM

## 2014-01-23 DIAGNOSIS — O09529 Supervision of elderly multigravida, unspecified trimester: Secondary | ICD-10-CM

## 2014-01-23 DIAGNOSIS — O09521 Supervision of elderly multigravida, first trimester: Secondary | ICD-10-CM

## 2014-01-23 LAB — PROGESTERONE: PROGESTERONE: 8 ng/mL

## 2014-01-23 LAB — WET PREP BY MOLECULAR PROBE
CANDIDA SPECIES: NEGATIVE
Gardnerella vaginalis: NEGATIVE
Trichomonas vaginosis: NEGATIVE

## 2014-01-23 LAB — HCG, QUANTITATIVE, PREGNANCY: hCG, Beta Chain, Quant, S: 30307.5 m[IU]/mL

## 2014-01-24 ENCOUNTER — Encounter: Payer: Self-pay | Admitting: Obstetrics & Gynecology

## 2014-01-24 DIAGNOSIS — O021 Missed abortion: Secondary | ICD-10-CM | POA: Insufficient documentation

## 2014-01-25 NOTE — Patient Instructions (Signed)
Dilation and Curettage or Vacuum Curettage Dilation and curettage (D&C) and vacuum curettage are minor procedures. A D&C involves stretching (dilation) the cervix and scraping (curettage) the inside lining of the womb (uterus). During a D&C, tissue is gently scraped from the inside lining of the uterus. During a vacuum curettage, the lining and tissue in the uterus are removed with the use of gentle suction.  Curettage may be performed to either diagnose or treat a problem. As a diagnostic procedure, curettage is performed to examine tissues from the uterus. A diagnostic curettage may be performed for the following symptoms:   Irregular bleeding in the uterus.   Bleeding with the development of clots.   Spotting between menstrual periods.   Prolonged menstrual periods.   Bleeding after menopause.   No menstrual period (amenorrhea).   A change in size and shape of the uterus.  As a treatment procedure, curettage may be performed for the following reasons:   Removal of an IUD (intrauterine device).   Removal of retained placenta after giving birth. Retained placenta can cause an infection or bleeding severe enough to require transfusions.   Abortion.   Miscarriage.   Removal of polyps inside the uterus.   Removal of uncommon types of noncancerous lumps (fibroids).  LET YOUR HEALTH CARE PROVIDER KNOW ABOUT:   Any allergies you have.   All medicines you are taking, including vitamins, herbs, eye drops, creams, and over-the-counter medicines.   Previous problems you or members of your family have had with the use of anesthetics.   Any blood disorders you have.   Previous surgeries you have had.   Medical conditions you have. RISKS AND COMPLICATIONS  Generally, this is a safe procedure. However, as with any procedure, complications can occur. Possible complications include:  Excessive bleeding.   Infection of the uterus.   Damage to the cervix.    Development of scar tissue (adhesions) inside the uterus, later causing abnormal amounts of menstrual bleeding.   Complications from the general anesthetic, if a general anesthetic is used.   Putting a hole (perforation) in the uterus. This is rare.  BEFORE THE PROCEDURE   Eat and drink before the procedure only as directed by your health care provider.   Arrange for someone to take you home.  PROCEDURE  This procedure usually takes about 15-30 minutes.  You will be given one of the following:  A medicine that numbs the area in and around the cervix (local anesthetic).   A medicine to make you sleep through the procedure (general anesthetic).  You will lie on your back with your legs in stirrups.   A warm metal or plastic instrument (speculum) will be placed in your vagina to keep it open and to allow the health care provider to see the cervix.  There are two ways in which your cervix can be softened and dilated. These include:   Taking a medicine.   Having thin rods (laminaria) inserted into your cervix.   A curved tool (curette) will be used to scrape cells from the inside lining of the uterus. In some cases, gentle suction is applied with the curette. The curette will then be removed.  AFTER THE PROCEDURE   You will rest in the recovery area until you are stable and are ready to go home.   You may feel sick to your stomach (nauseous) or throw up (vomit) if you were given a general anesthetic.   You may have a sore throat if a tube   was placed in your throat during general anesthesia.   You may have light cramping and bleeding. This may last for 2 days to 2 weeks after the procedure.   Your uterus needs to make a new lining after the procedure. This may make your next period late. Document Released: 04/17/2005 Document Revised: 12/18/2012 Document Reviewed: 11/14/2012 Exit[employer] Patient Information 2015 [employer]. This information is not intended to  replace advice given to you by your health care provider. Make sure you discuss any questions you have with your health care provider.  

## 2014-01-25 NOTE — Progress Notes (Signed)
Patient ID: Gina Banks, female   DOB: [birth year], 38 y.o.   MRN: [id number]  Chief Complaint  Patient presents with  . Follow-up    HPI Gina Banks is a 38 y.o. female.  The recent formal U/S result was reviewed.  HPI  Past Medical History  Diagnosis Date  . Menopause present   . Gastric bypass status for obesity   . Cyst of breast   . Hx of cholecystectomy   . Hypertension     Past Surgical History  Procedure Laterality Date  . Cholecystectomy    . Gastric bypass    . Breast cyst excision    . Fallopian tube cyst    . Cyst removal neck Right     Family History  Problem Relation Age of Onset  . Heart attack Paternal Grandfather   . Diabetes Father   . Hypertension Father   . Cancer Mother     Social History History  Substance Use Topics  . Smoking status: Never Smoker   . Smokeless tobacco: Never Used  . Alcohol Use: No    No Known Allergies  Current Outpatient Prescriptions  Medication Sig Dispense Refill  . ergocalciferol (VITAMIN D2) 50000 UNITS capsule Take 1 capsule (50,000 Units total) by mouth once a week.  4 capsule  1  . Prenatal Vit-Fe Fumarate-FA (PRENATAL MULTIVITAMIN) TABS tablet Take 1 tablet by mouth daily at 12 noon.       No current facility-administered medications for this visit.    Review of Systems Review of Systems Constitutional: negative for fatigue and weight loss Respiratory: negative for cough and wheezing Cardiovascular: negative for chest pain, fatigue and palpitations Gastrointestinal: negative for abdominal pain and change in bowel habits Genitourinary:negative for vaginal bleeding Integument/breast: negative for nipple discharge Musculoskeletal:negative for myalgias Neurological: negative for gait problems and tremors Behavioral/Psych: negative for abusive relationship, depression Endocrine: negative for temperature intolerance     Blood pressure 130/86, pulse 95, temperature 97.5 F (36.4 C),  weight 119.75 kg (264 lb), last menstrual period 11/13/2013.  Physical Exam Physical Exam   50% of 15 min visit spent on counseling and coordination of care.   Data Reviewed U/S, labs  Assessment    Missed abortion Management options reviewed: expectant, misoprostol or surgery.  She elects to proceed with a D&C     Plan    A suction D&C is planned Follow up as needed.        JACKSON-MOORE,Fadia Marlar A 01/25/2014, 8:45 AM

## 2014-01-27 ENCOUNTER — Other Ambulatory Visit: Payer: Self-pay | Admitting: *Deleted

## 2014-01-27 MED ORDER — DOXYCYCLINE HYCLATE 100 MG IV SOLR
200.0000 mg | Freq: Once | INTRAVENOUS | Status: AC
  Administered 2014-01-28: 200 mg via INTRAVENOUS
  Filled 2014-01-27: qty 200

## 2014-01-28 ENCOUNTER — Encounter (HOSPITAL_COMMUNITY): Payer: BC Managed Care – PPO | Admitting: Registered Nurse

## 2014-01-28 ENCOUNTER — Encounter (HOSPITAL_COMMUNITY): Payer: Self-pay | Admitting: Registered Nurse

## 2014-01-28 ENCOUNTER — Ambulatory Visit (HOSPITAL_COMMUNITY)
Admission: RE | Admit: 2014-01-28 | Discharge: 2014-01-28 | Disposition: A | Discharge status: Home / Self Care | Payer: BC Managed Care – PPO | Source: Ambulatory Visit | Attending: Obstetrics & Gynecology | Admitting: Obstetrics & Gynecology

## 2014-01-28 ENCOUNTER — Ambulatory Visit (HOSPITAL_COMMUNITY): Payer: BC Managed Care – PPO | Admitting: Registered Nurse

## 2014-01-28 ENCOUNTER — Encounter (HOSPITAL_COMMUNITY)
Admission: RE | Disposition: A | Discharge status: Home / Self Care | Payer: Self-pay | Source: Ambulatory Visit | Attending: Obstetrics & Gynecology

## 2014-01-28 DIAGNOSIS — O021 Missed abortion: Secondary | ICD-10-CM | POA: Insufficient documentation

## 2014-01-28 HISTORY — PX: DILATION AND CURETTAGE OF UTERUS: SHX78

## 2014-01-28 LAB — CBC
HCT: 34.3 % — ABNORMAL LOW (ref 36.0–46.0)
Hemoglobin: 11.5 g/dL — ABNORMAL LOW (ref 12.0–15.0)
MCH: 27.8 pg (ref 26.0–34.0)
MCHC: 33.5 g/dL (ref 30.0–36.0)
MCV: 82.9 fL (ref 78.0–100.0)
PLATELETS: 257 10*3/uL (ref 150–400)
RBC: 4.14 MIL/uL (ref 3.87–5.11)
RDW: 14.3 % (ref 11.5–15.5)
WBC: 4 10*3/uL (ref 4.0–10.5)

## 2014-01-28 SURGERY — DILATION AND CURETTAGE
Anesthesia: General | Site: Vagina

## 2014-01-28 MED ORDER — FENTANYL CITRATE 0.05 MG/ML IJ SOLN: 25.0000 ug | INTRAMUSCULAR | Status: DC | PRN

## 2014-01-28 MED ORDER — ONDANSETRON HCL 4 MG/2ML IJ SOLN
INTRAMUSCULAR | Status: DC | PRN
  Administered 2014-01-28: 4 mg via INTRAVENOUS

## 2014-01-28 MED ORDER — SODIUM CHLORIDE 0.9 % IJ SOLN: 3.0000 mL | INTRAMUSCULAR | Status: DC | PRN

## 2014-01-28 MED ORDER — LIDOCAINE HCL (CARDIAC) 20 MG/ML IV SOLN
INTRAVENOUS | Status: AC
  Filled 2014-01-28: qty 5

## 2014-01-28 MED ORDER — FENTANYL CITRATE 0.05 MG/ML IJ SOLN
INTRAMUSCULAR | Status: DC | PRN
  Administered 2014-01-28 (×2): 25 ug via INTRAVENOUS
  Administered 2014-01-28: 50 ug via INTRAVENOUS

## 2014-01-28 MED ORDER — SODIUM CHLORIDE 0.9 % IV SOLN: 250.0000 mL | INTRAVENOUS | Status: DC | PRN

## 2014-01-28 MED ORDER — FENTANYL CITRATE 0.05 MG/ML IJ SOLN
INTRAMUSCULAR | Status: AC
  Filled 2014-01-28: qty 2

## 2014-01-28 MED ORDER — LIDOCAINE HCL 1 % IJ SOLN
INTRAMUSCULAR | Status: DC | PRN
  Administered 2014-01-28: 10 mL

## 2014-01-28 MED ORDER — ACETAMINOPHEN 325 MG PO TABS: 650.0000 mg | ORAL_TABLET | ORAL | Status: DC | PRN

## 2014-01-28 MED ORDER — OXYCODONE-ACETAMINOPHEN 5-325 MG PO TABS: 1.0000 | ORAL_TABLET | Freq: Four times a day (QID) | ORAL | Status: DC | PRN

## 2014-01-28 MED ORDER — KETOROLAC TROMETHAMINE 30 MG/ML IJ SOLN
INTRAMUSCULAR | Status: DC | PRN
  Administered 2014-01-28: 30 mg via INTRAVENOUS

## 2014-01-28 MED ORDER — LIDOCAINE HCL (CARDIAC) 20 MG/ML IV SOLN
INTRAVENOUS | Status: DC | PRN
  Administered 2014-01-28: 100 mg via INTRAVENOUS

## 2014-01-28 MED ORDER — SCOPOLAMINE 1 MG/3DAYS TD PT72
MEDICATED_PATCH | TRANSDERMAL | Status: AC
  Administered 2014-01-28: 1.5 mg via TRANSDERMAL
  Filled 2014-01-28: qty 1

## 2014-01-28 MED ORDER — DEXAMETHASONE SODIUM PHOSPHATE 4 MG/ML IJ SOLN
INTRAMUSCULAR | Status: AC
  Filled 2014-01-28: qty 1

## 2014-01-28 MED ORDER — ONDANSETRON HCL 4 MG/2ML IJ SOLN
INTRAMUSCULAR | Status: AC
Start: 2014-01-28 — End: 2014-01-28
  Filled 2014-01-28: qty 2

## 2014-01-28 MED ORDER — DEXAMETHASONE SODIUM PHOSPHATE 4 MG/ML IJ SOLN
INTRAMUSCULAR | Status: DC | PRN
  Administered 2014-01-28: 4 mg via INTRAVENOUS

## 2014-01-28 MED ORDER — DOXYCYCLINE HYCLATE 100 MG PO CAPS: 100.0000 mg | ORAL_CAPSULE | Freq: Two times a day (BID) | ORAL | Status: DC

## 2014-01-28 MED ORDER — SODIUM CHLORIDE 0.9 % IJ SOLN: 3.0000 mL | Freq: Two times a day (BID) | INTRAMUSCULAR | Status: DC

## 2014-01-28 MED ORDER — MIDAZOLAM HCL 2 MG/2ML IJ SOLN
INTRAMUSCULAR | Status: AC
  Filled 2014-01-28: qty 2

## 2014-01-28 MED ORDER — MIDAZOLAM HCL 5 MG/5ML IJ SOLN
INTRAMUSCULAR | Status: DC | PRN
  Administered 2014-01-28: 2 mg via INTRAVENOUS

## 2014-01-28 MED ORDER — LACTATED RINGERS IV SOLN
INTRAVENOUS | Status: DC
  Administered 2014-01-28: 08:00:00 via INTRAVENOUS

## 2014-01-28 MED ORDER — PROPOFOL 10 MG/ML IV EMUL
INTRAVENOUS | Status: AC
  Filled 2014-01-28: qty 20

## 2014-01-28 MED ORDER — LIDOCAINE HCL 1 % IJ SOLN
INTRAMUSCULAR | Status: AC
  Filled 2014-01-28: qty 20

## 2014-01-28 MED ORDER — OXYCODONE HCL 5 MG PO TABS: 5.0000 mg | ORAL_TABLET | ORAL | Status: DC | PRN

## 2014-01-28 MED ORDER — ACETAMINOPHEN 650 MG RE SUPP
650.0000 mg | RECTAL | Status: DC | PRN
  Filled 2014-01-28: qty 1

## 2014-01-28 MED ORDER — SCOPOLAMINE 1 MG/3DAYS TD PT72
1.0000 | MEDICATED_PATCH | Freq: Once | TRANSDERMAL | Status: DC
  Administered 2014-01-28: 1.5 mg via TRANSDERMAL

## 2014-01-28 MED ORDER — PROPOFOL 10 MG/ML IV BOLUS
INTRAVENOUS | Status: DC | PRN
Start: 2014-01-28 — End: 2014-01-28
  Administered 2014-01-28: 200 mg via INTRAVENOUS

## 2014-01-28 MED ORDER — KETOROLAC TROMETHAMINE 30 MG/ML IJ SOLN
INTRAMUSCULAR | Status: AC
  Filled 2014-01-28: qty 1

## 2014-01-28 SURGICAL SUPPLY — 11 items
CATH ROBINSON RED A/P 16FR (CATHETERS) ×2 IMPLANT
CLOTH BEACON ORANGE TIMEOUT ST (SAFETY) ×2 IMPLANT
GLOVE BIO SURGEON STRL SZ 6.5 (GLOVE) ×2 IMPLANT
GOWN STRL REUS W/TWL LRG LVL3 (GOWN DISPOSABLE) ×4 IMPLANT
PACK VAGINAL MINOR WOMEN LF (CUSTOM PROCEDURE TRAY) ×2 IMPLANT
PAD OB MATERNITY 4.3X12.25 (PERSONAL CARE ITEMS) ×2 IMPLANT
PAD PREP 24X48 CUFFED NSTRL (MISCELLANEOUS) ×2 IMPLANT
SCRUB PCMX 4 OZ (MISCELLANEOUS) ×2 IMPLANT
SET BERKELEY SUCTION TUBING (SUCTIONS) ×1 IMPLANT
TOWEL OR 17X24 6PK STRL BLUE (TOWEL DISPOSABLE) ×4 IMPLANT
VACURETTE 7MM CVD STRL WRAP (CANNULA) ×1 IMPLANT

## 2014-01-28 NOTE — Op Note (Signed)
Preoperative diagnosis: Missed abortion  Postoperative diagnosis: Same  Procedure: Suction dilatation and curretage Surgeon: Antionette CharJACKSON-MOORE,Hudsen Fei A  Anesthesia: Laryngeal mask airway, paracervical block   Estimated blood loss: 100 ml  Urine output: per Anesthesiology   IV Fluids: per Anesthesiology  Complications: none  Specimen: PATHOLOGY  Operative Findings: Moderate products of conception  Description of procedure:   The patient was taken to the operating room and placed on the operating table in the semi-lithotomy position in [place] stirrups.  Examination under anesthesia was performed.  The patient was prepped and draped.  After a time-out had been completed, a speculum was placed in the vagina.  The anterior lip of the cervix was grasped with a single-toothed tenaculum.  A paracervical block was performed using 10 ml of 1% lidocaine.  The block was performed at 4 and 8 o'clock at the cervical vaginal junction. The cervix was dilated with Shawnie PonsPratt dilators.  A 7 -mm suction curet was inserted in the uterine cavity.  The device was activated and the curet rotated to evacuate the products of conception.   All the instruments were removed from the vagina.  Final instrument counts were correct.  The patient was taken to the PACU in stable condition.

## 2014-01-28 NOTE — H&P (Signed)
  Chief Complaint: 38 y.o.  para 1 who presents with a missed abortion  Details of Present Illness: A recent U/S was diagnostic of an early pregnancy failure/embryonic demise.  BP 134/88  Temp(Src) 99 F (37.2 C) (Oral)  Resp 20  Ht 5\' 7"  (1.702 m)  SpO2 100%  LMP 11/13/2013  Past Medical History  Diagnosis Date  . Menopause present   . Gastric bypass status for obesity   . Cyst of breast   . Hx of cholecystectomy   . Hypertension    History   Social History  . Marital Status: Married    Spouse Name: N/A    Number of Children: N/A  . Years of Education: N/A   Occupational History  . Not on file.   Social History Main Topics  . Smoking status: Never Smoker   . Smokeless tobacco: Never Used  . Alcohol Use: No  . Drug Use: No  . Sexual Activity: Yes    Birth Control/ Protection: None   Other Topics Concern  . Not on file   Social History Narrative  . No narrative on file   Family History  Problem Relation Age of Onset  . Heart attack Paternal Grandfather   . Diabetes Father   . Hypertension Father   . Cancer Mother     Pertinent items are noted in HPI.  Pre-Op Diagnosis: missed abortion 59820   Planned Procedure: Procedure(s): DILATATION AND CURETTAGE SUCTION  I have reviewed the patient's history and have completed the physical exam and Leanne ChangKimberly T Hilburn is acceptable for surgery.  Roseanna RainbowJACKSON-MOORE,Adrin Julian A, MD 01/28/2014 8:45 AM

## 2014-01-28 NOTE — Transfer of Care (Signed)
Immediate Anesthesia Transfer of Care Note  Patient: Gina Banks  Procedure(s) Performed: Procedure(s): DILATATION AND CURETTAGE SUCTION WITH CHROMOSOME STUDIES (N/A)  Patient Location: PACU  Anesthesia Type:General  Level of Consciousness: awake, alert  and oriented  Airway & Oxygen Therapy: Patient Spontanous Breathing and Patient connected to nasal cannula oxygen  Post-op Assessment: Report given to PACU RN  Post vital signs: Reviewed  Complications: No apparent anesthesia complications

## 2014-01-28 NOTE — Discharge Instructions (Signed)
D&C or Vacuum Curettage Care After Read the instructions below. Refer to this sheet in the next few weeks. These instructions provide you with general information on caring for yourself after you leave the hospital. Your caregiver may also give you specific instructions.  D&C or vacuum curettage is a minor operation. A D&C involves the stretching (dilatation) of the cervix and scraping (curettage) of the inside lining of the uterus. A vacuum curettage gently sucks out the lining and tissue in the uterus with a tube. You may have light cramping and bleeding for a couple of days to two weeks after the procedure. This procedure may be done in a hospital, outpatient clinic, or doctor's office. You may be given a drug to make you sleep (general anesthetic) or a drug that numbs the area (local anesthetic) in and around the cervix. HOME CARE INSTRUCTIONS  Do not drive for 24 hours.   Wait one week before returning to strenuous activities.   Take your temperature two times a day for 4 days and write it down. Provide these temperatures to your caregiver if they are abnormal (above 98.6 F or 37.0 C).   Avoid long periods of standing, and do no heavy lifting (more than 10 pounds), pushing or pulling.   Limit stair climbing to once or twice a day.   Take rest periods often.   You may resume your usual diet.   Drink plenty of fluids (6-8 glasses a day).   You should return to your usual bowel function. If constipation should occur, you may:   Take a mild laxative with permission from your caregiver.   Add fruit and bran to your diet.   Drink more fluids. This helps with constipation.   Take showers instead of baths until your caregiver gives you permission to take baths.   Do not go swimming or use a hot tub until your caregiver gives you permission.   Try to have someone with you or available for you the first 24 to 48 hours, especially if you had a general anesthetic.   Do not douche, use  tampons, or have intercourse until after your follow-up appointment, or when your caregiver approves.   Only take over-the-counter or prescription medicines for pain, discomfort, or fever as directed by your caregiver. Do not take aspirin. It can cause bleeding.   If a prescription was given, follow your caregiver's directions. You may be given a medicine that kills germs (antibiotic) to prevent an infection.   Keep all your follow-up appointments recommended by your caregiver.  SEEK MEDICAL CARE IF:  You have increasing cramps or pain not relieved with medication.   You develop belly (abdominal) pain which does not seem to be related to the same area of earlier cramping and pain.   You feel dizzy or feel like fainting.   You have bad smelling vaginal discharge.   You develop a rash.   You develop a reaction or allergy to your medication.  SEEK IMMEDIATE MEDICAL CARE IF:  Bleeding is heavier than a normal menstrual period.   You have an oral temperature above 100.6, not controlled by medicine.   You develop chest pain.   You develop shortness of breath.   You pass out.   You develop pain in your shoulder strap area.   You develop heavy vaginal bleeding with or without blood clots.  MAKE SURE YOU:   Understand these instructions.   Will watch your condition.   Will get help right away if  you are not doing well or get worse.  UPDATED HEALTH PRACTICES  A PAP smear is done to screen for cervical cancer.   The first PAP smear should be done at age 39.   Between ages 7521 and 4629, PAP smears are repeated every 2 years.   Beginning at age 39, you are advised to have a PAP smear every 3 years as long as your past 3 PAP smears have been normal.   Some women have medical problems that increase the chance of getting cervical cancer. Talk to your caregiver about these problems. It is especially important to talk to your caregiver if a new problem develops soon after your last PAP  smear. In these cases, your caregiver may recommend more frequent screening and Pap smears.   The above recommendations are the same for women who have or have not gotten the vaccine for HPV (Human Papillomavirus).   If you had a hysterectomy for a problem that was not a cancer or a condition that could lead to cancer, then you no longer need Pap smears.   If you are between ages 4265 and 2070, and you have had normal Pap smears going back 10 years, you no longer need Pap smears.   If you have had past treatment for cervical cancer or a condition that could lead to cancer, you need Pap smears and screening for cancer for at least 20 years after your treatment.   Continue monthly self-breast examinations. Your caregiver can provide information and instructions for self-breast examination.  Document Released: 04/14/2000 Document Re-Released: 10/05/2009 [facility] Patient Information 2011 [place], [place]. Post Anesthesia Home Care Instructions  Activity: Get plenty of rest for the remainder of the day. A responsible adult should stay with you for 24 hours following the procedure.  For the next 24 hours, DO NOT: -Drive a car -Advertising copywriterperate machinery -Drink alcoholic beverages -Take any medication unless instructed by your physician -Make any legal decisions or sign important papers.  Meals: Start with liquid foods such as gelatin or soup. Progress to regular foods as tolerated. Avoid greasy, spicy, heavy foods. If nausea and/or vomiting occur, drink only clear liquids until the nausea and/or vomiting subsides. Call your physician if vomiting continues.  Special Instructions/Symptoms: Your throat may feel dry or sore from the anesthesia or the breathing tube placed in your throat during surgery. If this causes discomfort, gargle with warm salt water. The discomfort should disappear within 24 hours.   May take ibuprofen after 3:11 PM today.

## 2014-01-28 NOTE — Anesthesia Postprocedure Evaluation (Signed)
  Anesthesia Post-op Note  Anesthesia Post Note  Patient: Gina Banks  Procedure(s) Performed: Procedure(s) (LRB): DILATATION AND CURETTAGE SUCTION WITH CHROMOSOME STUDIES (N/A)  Anesthesia type: General  Patient location: PACU  Post pain: Pain level controlled  Post assessment: Post-op Vital signs reviewed  Last Vitals:  Filed Vitals:   01/28/14 1115  BP: 140/84  Pulse: 74  Temp: 36.7 C  Resp: 16    Post vital signs: Reviewed  Level of consciousness: sedated  Complications: No apparent anesthesia complications

## 2014-01-28 NOTE — Anesthesia Preprocedure Evaluation (Signed)
Anesthesia Evaluation  Patient identified by MRN, date of birth, ID band Patient awake    Reviewed: Allergy & Precautions, H&P , Patient's Chart, lab work & pertinent test results, reviewed documented beta blocker date and time   Airway Mallampati: II  TM Distance: >3 FB Neck ROM: full    Dental no notable dental hx.    Pulmonary  breath sounds clear to auscultation  Pulmonary exam normal       Cardiovascular hypertension, Rhythm:regular Rate:Normal     Neuro/Psych    GI/Hepatic   Endo/Other  Morbid obesity  Renal/GU      Musculoskeletal   Abdominal   Peds  Hematology   Anesthesia Other Findings   Reproductive/Obstetrics                             Anesthesia Physical Anesthesia Plan  ASA: III  Anesthesia Plan:    Post-op Pain Management:    Induction: Intravenous  Airway Management Planned: LMA  Additional Equipment:   Intra-op Plan:   Post-operative Plan:   Informed Consent: I have reviewed the patients History and Physical, chart, labs and discussed the procedure including the risks, benefits and alternatives for the proposed anesthesia with the patient or authorized representative who has indicated his/her understanding and acceptance.   Dental Advisory Given and Dental advisory given  Plan Discussed with: CRNA and Surgeon  Anesthesia Plan Comments: (Discussed GA with LMA, possible sore throat, potential need to switch to ETT, N/V, pulmonary aspiration. Questions answered. )        Anesthesia Quick Evaluation  

## 2014-01-29 ENCOUNTER — Encounter (HOSPITAL_COMMUNITY): Payer: Self-pay | Admitting: Obstetrics & Gynecology

## 2014-01-29 ENCOUNTER — Encounter: Payer: Self-pay | Admitting: *Deleted

## 2014-02-11 ENCOUNTER — Other Ambulatory Visit: Payer: Self-pay | Admitting: *Deleted

## 2014-02-11 DIAGNOSIS — Z9889 Other specified postprocedural states: Secondary | ICD-10-CM

## 2014-02-11 NOTE — Progress Notes (Signed)
Pt called to ask if she could have her post op labs drawn at the hospital.   Return call to pt.  Left message that pt could have labs drawn at the hospital or any [facility]. Pt advised to contact office with any concerns.

## 2014-02-12 ENCOUNTER — Inpatient Hospital Stay (HOSPITAL_COMMUNITY)
Admission: AD | Admit: 2014-02-12 | Discharge: 2014-02-12 | Disposition: A | Discharge status: Home / Self Care | Payer: BC Managed Care – PPO | Source: Ambulatory Visit | Attending: Obstetrics | Admitting: Obstetrics

## 2014-02-12 ENCOUNTER — Encounter (HOSPITAL_COMMUNITY): Payer: Self-pay | Admitting: *Deleted

## 2014-02-12 DIAGNOSIS — Z09 Encounter for follow-up examination after completed treatment for conditions other than malignant neoplasm: Secondary | ICD-10-CM | POA: Diagnosis present

## 2014-02-12 DIAGNOSIS — O039 Complete or unspecified spontaneous abortion without complication: Secondary | ICD-10-CM | POA: Insufficient documentation

## 2014-02-12 LAB — CHROMOSOME STD, POC(TISSUE)-NCBH

## 2014-02-12 LAB — HCG, QUANTITATIVE, PREGNANCY: hCG, Beta Chain, Quant, S: 10 m[IU]/mL — ABNORMAL HIGH (ref <5)

## 2014-02-12 NOTE — MAU Note (Addendum)
Had a miscarriage  A couple wks ago with a D&C.  Has a hard time getting to office for f/u- instructed to come here- for blood work, ? Hormone level, cbc.

## 2014-02-12 NOTE — MAU Note (Signed)
Pt not in lobby, ? Left after blood draw.

## 2014-03-02 ENCOUNTER — Encounter (HOSPITAL_COMMUNITY): Payer: Self-pay | Admitting: *Deleted

## 2014-03-16 ENCOUNTER — Encounter: Payer: Self-pay | Admitting: Obstetrics & Gynecology

## 2014-03-16 ENCOUNTER — Ambulatory Visit (INDEPENDENT_AMBULATORY_CARE_PROVIDER_SITE_OTHER): Payer: BC Managed Care – PPO | Admitting: Obstetrics

## 2014-03-16 VITALS — BP 132/96 | HR 89 | Ht 67.5 in | Wt 258.0 lb

## 2014-03-16 DIAGNOSIS — B369 Superficial mycosis, unspecified: Secondary | ICD-10-CM | POA: Insufficient documentation

## 2014-03-16 DIAGNOSIS — O021 Missed abortion: Secondary | ICD-10-CM

## 2014-03-16 DIAGNOSIS — B3731 Acute candidiasis of vulva and vagina: Secondary | ICD-10-CM | POA: Insufficient documentation

## 2014-03-16 DIAGNOSIS — B373 Candidiasis of vulva and vagina: Secondary | ICD-10-CM

## 2014-03-16 MED ORDER — CLOTRIMAZOLE 1 % EX CREA
1.0000 | TOPICAL_CREAM | Freq: Two times a day (BID) | CUTANEOUS | Status: DC
Start: 2014-03-16 — End: 2014-09-14

## 2014-03-16 MED ORDER — FLUCONAZOLE 150 MG PO TABS: 150.0000 mg | ORAL_TABLET | Freq: Once | ORAL | Status: DC

## 2014-03-16 NOTE — Progress Notes (Signed)
Patient ID: Gina Banks, female   DOB: 1975-11-21, 38 y.o.   MRN: [id number]  Chief Complaint  Patient presents with  . Routine Post Op    HPI Gina Banks is a 38 y.o. female.  S/P D&E for Missed Abortion.  C/O   HPI  Past Medical History  Diagnosis Date  . Menopause present   . Gastric bypass status for obesity   . Cyst of breast   . Hx of cholecystectomy   . Hypertension     Past Surgical History  Procedure Laterality Date  . Cholecystectomy    . Gastric bypass    . Breast cyst excision    . Fallopian tube cyst    . Cyst removal neck Right   . Dilation and curettage of uterus N/A 01/28/2014    Procedure: DILATATION AND CURETTAGE SUCTION WITH CHROMOSOME STUDIES;  Surgeon: Antionette CharLisa Jackson-Moore, MD;  Location: [facility];  Service: Gynecology;  Laterality: N/A;    Family History  Problem Relation Age of Onset  . Heart attack Paternal Grandfather   . Diabetes Father   . Hypertension Father   . Cancer Mother     Social History History  Substance Use Topics  . Smoking status: Never Smoker   . Smokeless tobacco: Never Used  . Alcohol Use: No    No Known Allergies  Current Outpatient Prescriptions  Medication Sig Dispense Refill  . Prenatal Vit-Fe Fumarate-FA (PRENATAL MULTIVITAMIN) TABS tablet Take 1 tablet by mouth daily at 12 noon.    . clotrimazole (LOTRIMIN) 1 % cream Apply 1 application topically 2 (two) times daily. 113 g 2  . fluconazole (DIFLUCAN) 150 MG tablet Take 1 tablet (150 mg total) by mouth once. 1 tablet 2   No current facility-administered medications for this visit.    Review of Systems Review of Systems Constitutional: negative for fatigue and weight loss Respiratory: negative for cough and wheezing Cardiovascular: negative for chest pain, fatigue and palpitations Gastrointestinal: negative for abdominal pain and change in bowel habits Genitourinary:negative Integument/breast: negative for nipple  discharge Musculoskeletal:negative for myalgias Neurological: negative for gait problems and tremors Behavioral/Psych: negative for abusive relationship, depression Endocrine: negative for temperature intolerance     Blood pressure 132/96, pulse 89, height 5' 7.5" (1.715 m), weight 258 lb (117.028 kg), last menstrual period 02/11/2014, unknown if currently breastfeeding.  Physical Exam Physical Exam General:   alert  Skin:   no rash or abnormalities  Lungs:   clear to auscultation bilaterally  Heart:   regular rate and rhythm, S1, S2 normal, no murmur, click, rub or gallop  Breasts:   normal without suspicious masses, skin or nipple changes or axillary nodes  Abdomen:  normal findings: no organomegaly, soft, non-tender and no hernia  Pelvis:  External genitalia: irregular fungal like rash of vulva and groin. Urinary system: urethral meatus normal and bladder without fullness, nontender Vaginal: normal without tenderness, induration or masses Cervix: normal appearance Adnexa: normal bimanual exam Uterus: anteverted and non-tender, normal size      Data Reviewed Labs   Assessment    Missed Abortion.  Post op exam.  No complaints.  Vulva itching.  Probable Jock Itch.    Plan    Clotrimazole cream Rx Diflucan Rx F/U for Annual  Orders Placed This Encounter  Procedures  . WET PREP BY MOLECULAR PROBE   Meds ordered this encounter  Medications  . clotrimazole (LOTRIMIN) 1 % cream    Sig: Apply 1 application topically 2 (two) times daily.  Dispense:  113 g    Refill:  2  . fluconazole (DIFLUCAN) 150 MG tablet    Sig: Take 1 tablet (150 mg total) by mouth once.    Dispense:  1 tablet    Refill:  2      Donnella Morford A 03/16/2014, 5:40 PM

## 2014-03-17 LAB — WET PREP BY MOLECULAR PROBE
Candida species: NEGATIVE
Gardnerella vaginalis: POSITIVE — AB
Trichomonas vaginosis: NEGATIVE

## 2014-03-18 ENCOUNTER — Other Ambulatory Visit: Payer: Self-pay | Admitting: Obstetrics

## 2014-03-18 DIAGNOSIS — N760 Acute vaginitis: Principal | ICD-10-CM

## 2014-03-18 DIAGNOSIS — B9689 Other specified bacterial agents as the cause of diseases classified elsewhere: Secondary | ICD-10-CM

## 2014-03-18 MED ORDER — TINIDAZOLE 500 MG PO TABS: 1000.0000 mg | ORAL_TABLET | Freq: Every day | ORAL | Status: DC

## 2014-04-27 ENCOUNTER — Encounter: Payer: Self-pay | Admitting: *Deleted

## 2014-04-28 ENCOUNTER — Encounter: Payer: Self-pay | Admitting: Obstetrics & Gynecology

## 2014-06-01 ENCOUNTER — Ambulatory Visit: Payer: BC Managed Care – PPO | Admitting: Obstetrics & Gynecology

## 2014-09-08 ENCOUNTER — Other Ambulatory Visit: Payer: Self-pay

## 2014-09-09 ENCOUNTER — Other Ambulatory Visit (INDEPENDENT_AMBULATORY_CARE_PROVIDER_SITE_OTHER): Payer: BC Managed Care – PPO

## 2014-09-09 VITALS — BP 151/87 | HR 95 | Temp 99.8°F | Ht 67.5 in | Wt 260.0 lb

## 2014-09-09 DIAGNOSIS — Z3200 Encounter for pregnancy test, result unknown: Secondary | ICD-10-CM | POA: Diagnosis not present

## 2014-09-09 LAB — POCT URINE PREGNANCY: Preg Test, Ur: POSITIVE

## 2014-09-09 NOTE — Progress Notes (Unsigned)
Patient in office today for a pregnancy test. Patient states she had a positive home pregnancy test. Patient's pregnancy test in office is positive. Prenatal vitamins given. NOB scheduled.

## 2014-09-10 LAB — HCG, QUANTITATIVE, PREGNANCY: hCG, Beta Chain, Quant, S: 193.3 m[IU]/mL

## 2014-09-14 ENCOUNTER — Inpatient Hospital Stay (HOSPITAL_COMMUNITY): Payer: BC Managed Care – PPO

## 2014-09-14 ENCOUNTER — Inpatient Hospital Stay (HOSPITAL_COMMUNITY)
Admission: AD | Admit: 2014-09-14 | Discharge: 2014-09-14 | Disposition: A | Discharge status: Home / Self Care | Payer: BC Managed Care – PPO | Source: Ambulatory Visit | Attending: Obstetrics | Admitting: Obstetrics

## 2014-09-14 ENCOUNTER — Encounter (HOSPITAL_COMMUNITY): Payer: Self-pay | Admitting: *Deleted

## 2014-09-14 DIAGNOSIS — O209 Hemorrhage in early pregnancy, unspecified: Secondary | ICD-10-CM | POA: Diagnosis present

## 2014-09-14 DIAGNOSIS — O200 Threatened abortion: Secondary | ICD-10-CM

## 2014-09-14 DIAGNOSIS — O09521 Supervision of elderly multigravida, first trimester: Secondary | ICD-10-CM | POA: Insufficient documentation

## 2014-09-14 DIAGNOSIS — O3680X0 Pregnancy with inconclusive fetal viability, not applicable or unspecified: Secondary | ICD-10-CM

## 2014-09-14 DIAGNOSIS — Z3A01 Less than 8 weeks gestation of pregnancy: Secondary | ICD-10-CM | POA: Diagnosis not present

## 2014-09-14 LAB — CBC
HCT: 32.4 % — ABNORMAL LOW (ref 36.0–46.0)
HEMOGLOBIN: 10.5 g/dL — AB (ref 12.0–15.0)
MCH: 27.3 pg (ref 26.0–34.0)
MCHC: 32.4 g/dL (ref 30.0–36.0)
MCV: 84.2 fL (ref 78.0–100.0)
Platelets: 261 10*3/uL (ref 150–400)
RBC: 3.85 MIL/uL — ABNORMAL LOW (ref 3.87–5.11)
RDW: 16.2 % — ABNORMAL HIGH (ref 11.5–15.5)
WBC: 6.4 10*3/uL (ref 4.0–10.5)

## 2014-09-14 LAB — URINE MICROSCOPIC-ADD ON

## 2014-09-14 LAB — URINALYSIS, ROUTINE W REFLEX MICROSCOPIC
Bilirubin Urine: NEGATIVE
Glucose, UA: NEGATIVE mg/dL
Ketones, ur: NEGATIVE mg/dL
Leukocytes, UA: NEGATIVE
NITRITE: NEGATIVE
Protein, ur: NEGATIVE mg/dL
Specific Gravity, Urine: >1.03 — ABNORMAL HIGH (ref 1.005–1.030)
UROBILINOGEN UA: 0.2 mg/dL (ref 0.0–1.0)
pH: 5.5 (ref 5.0–8.0)

## 2014-09-14 LAB — HCG, QUANTITATIVE, PREGNANCY: hCG, Beta Chain, Quant, S: 151 m[IU]/mL — ABNORMAL HIGH (ref <5)

## 2014-09-14 MED ORDER — HYDROCODONE-ACETAMINOPHEN 5-325 MG PO TABS: 1.0000 | ORAL_TABLET | ORAL | Status: DC | PRN

## 2014-09-14 MED ORDER — ACETAMINOPHEN 500 MG PO TABS
1000.0000 mg | ORAL_TABLET | Freq: Once | ORAL | Status: AC
  Administered 2014-09-14: 1000 mg via ORAL
  Filled 2014-09-14: qty 2

## 2014-09-14 NOTE — MAU Note (Signed)
Pt states had tangerine sized clot then cramping and bleeding wasn't as bad.

## 2014-09-14 NOTE — MAU Note (Signed)
Pt reports she  Went to the [facility] today and had some bleeding and blood clots. Has been having mild cramping on and off this weekend.Some cramping reported today as well.

## 2014-09-14 NOTE — MAU Provider Note (Signed)
History     CSN: [id number]  Arrival date and time: 09/14/14 1138   None     Chief Complaint  Patient presents with  . Vaginal Bleeding   HPI   Ms.Gina Banks is a 39 y.o. female [id number] at 461w3d who presents with vaginal bleeding. She started bleeding today, last week her discharge was brown. She was seen in Dr. Thomes LollingHarpers office last week and had a postitive quant/ urine pregnancy test. She started bleeding today; bright red, small amount. She did pass a clot earlier today. She is having some mild abdominal cramping that radiates to her back. She has not taken anything for the pain today.    Quant done in Dr. Verdell CarmineHarper's office was around 190  OB History    Gravida Para Term Preterm AB TAB SAB Ectopic Multiple Living   4 1 1  2 1 1   1       Past Medical History  Diagnosis Date  . Menopause present   . Gastric bypass status for obesity   . Cyst of breast   . Hx of cholecystectomy   . Hypertension     Past Surgical History  Procedure Laterality Date  . Cholecystectomy    . Gastric bypass    . Breast cyst excision    . Fallopian tube cyst    . Cyst removal neck Right   . Dilation and curettage of uterus N/A 01/28/2014    Procedure: DILATATION AND CURETTAGE SUCTION WITH CHROMOSOME STUDIES;  Surgeon: Antionette CharLisa Jackson-Moore, MD;  Location: [facility];  Service: Gynecology;  Laterality: N/A;    Family History  Problem Relation Age of Onset  . Heart attack Paternal Grandfather   . Diabetes Father   . Hypertension Father   . Cancer Mother     History  Substance Use Topics  . Smoking status: Never Smoker   . Smokeless tobacco: Never Used  . Alcohol Use: No    Allergies: No Known Allergies  Prescriptions prior to admission  Medication Sig Dispense Refill Last Dose  . acetaminophen (TYLENOL) 500 MG tablet Take 1,000 mg by mouth every 6 (six) hours as needed.   09/13/2014 at Unknown time  . Prenatal Vit-Fe Fumarate-FA (PRENATAL MULTIVITAMIN) TABS tablet Take 1 tablet  by mouth daily at 12 noon.   09/14/2014 at Unknown time  . clotrimazole (LOTRIMIN) 1 % cream Apply 1 application topically 2 (two) times daily. (Patient not taking: Reported on 09/14/2014) 113 g 2   . fluconazole (DIFLUCAN) 150 MG tablet Take 1 tablet (150 mg total) by mouth once. (Patient not taking: Reported on 09/14/2014) 1 tablet 2   . tinidazole (TINDAMAX) 500 MG tablet Take 2 tablets (1,000 mg total) by mouth daily with breakfast. (Patient not taking: Reported on 09/14/2014) 10 tablet 2    Results for orders placed or performed during the hospital encounter of 09/14/14 (from the past 48 hour(s))  Urinalysis, Routine w reflex microscopic     Status: Abnormal   Collection Time: 09/14/14 11:20 AM  Result Value Ref Range   Color, Urine YELLOW YELLOW   APPearance CLOUDY (A) CLEAR   Specific Gravity, Urine >1.030 (H) 1.005 - 1.030   pH 5.5 5.0 - 8.0   Glucose, UA NEGATIVE NEGATIVE mg/dL   Hgb urine dipstick LARGE (A) NEGATIVE   Bilirubin Urine NEGATIVE NEGATIVE   Ketones, ur NEGATIVE NEGATIVE mg/dL   Protein, ur NEGATIVE NEGATIVE mg/dL   Urobilinogen, UA 0.2 0.0 - 1.0 mg/dL   Nitrite NEGATIVE NEGATIVE  Leukocytes, UA NEGATIVE NEGATIVE  Urine microscopic-add on     Status: None   Collection Time: 09/14/14 11:20 AM  Result Value Ref Range   WBC, UA 0-2 <3 WBC/hpf   RBC / HPF TOO NUMEROUS TO COUNT <3 RBC/hpf  CBC     Status: Abnormal   Collection Time: 09/14/14  1:10 PM  Result Value Ref Range   WBC 6.4 4.0 - 10.5 K/uL   RBC 3.85 (L) 3.87 - 5.11 MIL/uL   Hemoglobin 10.5 (L) 12.0 - 15.0 g/dL   HCT 16.1 (L) 09.6 - 04.5 %   MCV 84.2 78.0 - 100.0 fL   MCH 27.3 26.0 - 34.0 pg   MCHC 32.4 30.0 - 36.0 g/dL   RDW 40.9 (H) 81.1 - 91.4 %   Platelets 261 150 - 400 K/uL  hCG, quantitative, pregnancy     Status: Abnormal   Collection Time: 09/14/14  1:10 PM  Result Value Ref Range   hCG, Beta Chain, Quant, S 151 (H) <5 mIU/mL    Comment:          GEST. AGE      CONC.  (mIU/mL)   <=1 WEEK         5 - 50     2 WEEKS       50 - 500     3 WEEKS       100 - 10,000     4 WEEKS     1,000 - 30,000     5 WEEKS     3,500 - 115,000   6-8 WEEKS     12,000 - 270,000    12 WEEKS     15,000 - 220,000        FEMALE AND NON-PREGNANT FEMALE:     LESS THAN 5 mIU/mL     US Ob Comp Less 14 Wks  09/14/2014   CLINICAL DATA:  Pregnant, bleeding, beta HCG 151  EXAM: OBSTETRIC <14 WK [place] AND TRANSVAGINAL OB US  TECHNIQUE: Both transabdominal and transvaginal ultrasound examinations were performed for complete evaluation of the gestation as well as the maternal uterus, adnexal regions, and pelvic cul-de-sac. Transvaginal technique was performed to assess early pregnancy.  COMPARISON:  None.  FINDINGS: Intrauterine gestational sac: Not visualized  Maternal uterus/adnexae: Right ovary is within normal limits.  Left ovary is notable for a 1.5 x 1.4 x 1.3 cm simple cyst.  No free fluid.  IMPRESSION: No IUP is visualized, not unexpected given the low beta HCG.  By definition, this reflects a pregnancy of unknown location. Differential considerations include early normal IUP, abnormal IUP/missed abortion, or nonvisualized ectopic pregnancy.  Serial beta HCG is suggested, supplemented by repeat pelvic ultrasound in 14 days (or earlier as clinically warranted).   Electronically Signed   By: Charline Bills M.D.   On: 09/14/2014 15:02   US Ob Transvaginal  09/14/2014   CLINICAL DATA:  Pregnant, bleeding, beta HCG 151  EXAM: OBSTETRIC <14 WK [place] AND TRANSVAGINAL OB US  TECHNIQUE: Both transabdominal and transvaginal ultrasound examinations were performed for complete evaluation of the gestation as well as the maternal uterus, adnexal regions, and pelvic cul-de-sac. Transvaginal technique was performed to assess early pregnancy.  COMPARISON:  None.  FINDINGS: Intrauterine gestational sac: Not visualized  Maternal uterus/adnexae: Right ovary is within normal limits.  Left ovary is notable for a 1.5 x 1.4 x 1.3 cm simple cyst.   No free fluid.  IMPRESSION: No IUP is visualized, not unexpected given the  low beta HCG.  By definition, this reflects a pregnancy of unknown location. Differential considerations include early normal IUP, abnormal IUP/missed abortion, or nonvisualized ectopic pregnancy.  Serial beta HCG is suggested, supplemented by repeat pelvic ultrasound in 14 days (or earlier as clinically warranted).   Electronically Signed   By: Charline BillsSriyesh  Krishnan M.D.   On: 09/14/2014 15:02     Review of Systems  Constitutional: Negative for fever and chills.  Gastrointestinal: Positive for abdominal pain. Negative for nausea and vomiting.  Musculoskeletal: Positive for back pain.   Physical Exam   Blood pressure 136/89, pulse 80, temperature 98.3 F (36.8 C), resp. rate 18, height 5' 7.5" (1.715 m), weight 117.845 kg (259 lb 12.8 oz), last menstrual period 08/07/2014, not currently breastfeeding.  Physical Exam  Constitutional: She is oriented to person, place, and time. She appears well-developed and well-nourished. No distress.  HENT:  Head: Normocephalic.  Eyes: Pupils are equal, round, and reactive to light.  Neck: Neck supple.  Respiratory: Effort normal.  GI: Soft. There is no tenderness.  Genitourinary:  Bimanual exam: Cervix closed, posterior Moderate amount of dark red blood noted on exam glove and around perineum   Neurological: She is alert and oriented to person, place, and time.  Skin: Skin is warm. She is not diaphoretic.  Psychiatric: Her behavior is normal.    MAU Course  Procedures  None  MDM  O positive blood type   Tylenol 1 gram given in MAU   Assessment and Plan   A:  1. Pregnancy of unknown anatomic location   2. Vaginal bleeding in pregnancy, first trimester   3. Threatened miscarriage in early pregnancy     P:  Discharge home in stable condition Return Wednesday to MAU for repeat beta hcg level RX: vicodin  Ectopic precautions Bleeding precautions Pelvic  rest Return if symptoms worsen Support given   Duane LopeJennifer I Tykee Heideman, NP 09/14/2014 2:26 PM

## 2014-09-14 NOTE — MAU Note (Signed)
Urine in lab 

## 2014-09-16 ENCOUNTER — Inpatient Hospital Stay (HOSPITAL_COMMUNITY)
Admission: AD | Admit: 2014-09-16 | Discharge: 2014-09-16 | Disposition: A | Discharge status: Home / Self Care | Payer: BC Managed Care – PPO | Source: Ambulatory Visit | Attending: Obstetrics | Admitting: Obstetrics

## 2014-09-16 DIAGNOSIS — Z3A01 Less than 8 weeks gestation of pregnancy: Secondary | ICD-10-CM | POA: Insufficient documentation

## 2014-09-16 DIAGNOSIS — O209 Hemorrhage in early pregnancy, unspecified: Secondary | ICD-10-CM | POA: Diagnosis not present

## 2014-09-16 DIAGNOSIS — O039 Complete or unspecified spontaneous abortion without complication: Secondary | ICD-10-CM

## 2014-09-16 LAB — HCG, QUANTITATIVE, PREGNANCY: HCG, BETA CHAIN, QUANT, S: 26 m[IU]/mL — AB (ref <5)

## 2014-09-16 NOTE — MAU Provider Note (Signed)
Gina LipsKimberly T Banks [age] y.o. [id number] @[redacted]w[redacted]d  presents to MAU for f/u HCG. She was previously seen for vaginal bleeding in pregnancy.  It has been going on x 6 days now.  She saw Dr. Clearance CootsHarper last Thursday for NOB.  She had no bleeding at that time.  Then she noted brown discharge and it became red spotting the following day.  She came in for eval on 5/16 for same.  HCG/US, etc done to eval for possible ectopic.  Diagnosed with threatened AB and pregnancy of unknown location.   She continues to have vaginal bleeding like a period along with abdominal pain.  No increase in pain.  No uncontrolled pain.  No symptoms of dizziness/weakness/syncope.    BP 147/97 mmHg  Pulse 64  Temp(Src) 98.5 F (36.9 C) (Oral)  Resp 18  LMP 08/07/2014 General: WDWN female in NAD Heart: regular rate Resp: no respiratory distress HCG: on 5/16 was 151 Today's HCG is 26  Symptoms and labs are consistent with SAB.  Pt to return in 48 hours to MAU to fu quant if unable to see Dr. Clearance CootsHarper.  Would like to see HCG <5. Pt advised to see Dr. Clearance CootsHarper to consider blood pressure and overall wellness for further pregnancy considerations.

## 2014-09-16 NOTE — Discharge Instructions (Signed)
Miscarriage °A miscarriage is the loss of an unborn baby (fetus) before the 20th week of pregnancy. The cause is often unknown.  °HOME CARE °· You may need to stay in bed (bed rest), or you may be able to do light activity. Go about activity as told by your doctor. °· Have help at home. °· Write down how many pads you use each day. Write down how soaked they are. °· Do not use tampons. Do not wash out your vagina (douche) or have sex (intercourse) until your doctor approves. °· Only take medicine as told by your doctor. °· Do not take aspirin. °· Keep all doctor visits as told. °· If you or your partner have problems with grieving, talk to your doctor. You can also try counseling. Give yourself time to grieve before trying to get pregnant again. °GET HELP RIGHT AWAY IF: °· You have bad cramps or pain in your back or belly (abdomen). °· You have a fever. °· You pass large clumps of blood (clots) from your vagina that are walnut-sized or larger. Save the clumps for your doctor to see. °· You pass large amounts of tissue from your vagina. Save the tissue for your doctor to see. °· You have more bleeding. °· You have thick, bad-smelling fluid (discharge) coming from the vagina. °· You get lightheaded, weak, or you pass out (faint). °· You have chills. °MAKE SURE YOU: °· Understand these instructions. °· Will watch your condition. °· Will get help right away if you are not doing well or get worse. °Document Released: 07/10/2011 Document Reviewed: 07/10/2011 °Exit[employer]® Patient Information ©2015 [employer]. This information is not intended to replace advice given to you by your health care provider. Make sure you discuss any questions you have with your health care provider. ° °

## 2014-09-16 NOTE — MAU Note (Signed)
Here for repeat HCG.  States is still bleeding, not as heavy as the other day, more like a period.  Still having some clots.

## 2014-10-21 ENCOUNTER — Encounter: Payer: Self-pay | Admitting: Obstetrics

## 2014-10-21 ENCOUNTER — Ambulatory Visit (INDEPENDENT_AMBULATORY_CARE_PROVIDER_SITE_OTHER): Payer: BC Managed Care – PPO | Admitting: Obstetrics

## 2014-10-21 VITALS — BP 134/96 | HR 81 | Temp 99.1°F | Ht 67.5 in | Wt 262.0 lb

## 2014-10-21 DIAGNOSIS — O021 Missed abortion: Secondary | ICD-10-CM

## 2014-10-21 DIAGNOSIS — IMO0002 Reserved for concepts with insufficient information to code with codable children: Secondary | ICD-10-CM

## 2014-10-21 DIAGNOSIS — Z0000 Encounter for general adult medical examination without abnormal findings: Secondary | ICD-10-CM | POA: Diagnosis not present

## 2014-10-21 LAB — POCT URINE PREGNANCY: PREG TEST UR: NEGATIVE

## 2014-10-21 NOTE — Progress Notes (Signed)
Patient ID: Gina Banks, female   DOB: [birth year], 39 y.o.   MRN: [id number]  Chief Complaint  Patient presents with  . Follow-up    HPI Gina Banks is a 39 y.o. female.  Recent missed abortion.  She is an elderly multigravida at age 71.  Wants another pregnancy. HPI  Past Medical History  Diagnosis Date  . Menopause present   . Gastric bypass status for obesity   . Cyst of breast   . Hx of cholecystectomy   . Hypertension     Past Surgical History  Procedure Laterality Date  . Cholecystectomy    . Gastric bypass    . Breast cyst excision    . Fallopian tube cyst    . Cyst removal neck Right   . Dilation and curettage of uterus N/A 01/28/2014    Procedure: DILATATION AND CURETTAGE SUCTION WITH CHROMOSOME STUDIES;  Surgeon: Antionette Char, MD;  Location: [facility];  Service: Gynecology;  Laterality: N/A;    Family History  Problem Relation Age of Onset  . Heart attack Paternal Grandfather   . Diabetes Father   . Hypertension Father   . Cancer Mother     Social History History  Substance Use Topics  . Smoking status: Never Smoker   . Smokeless tobacco: Never Used  . Alcohol Use: No    No Known Allergies  Current Outpatient Prescriptions  Medication Sig Dispense Refill  . Prenatal Vit-Fe Fumarate-FA (PRENATAL MULTIVITAMIN) TABS tablet Take 1 tablet by mouth daily at 12 noon.    Marland Kitchen acetaminophen (TYLENOL) 500 MG tablet Take 1,000 mg by mouth every 6 (six) hours as needed.    Marland Kitchen HYDROcodone-acetaminophen (NORCO/VICODIN) 5-325 MG per tablet Take 1-2 tablets by mouth every 4 (four) hours as needed. (Patient not taking: Reported on 10/21/2014) 6 tablet 0   No current facility-administered medications for this visit.    Review of Systems Review of Systems Constitutional: negative for fatigue and weight loss Respiratory: negative for cough and wheezing Cardiovascular: negative for chest pain, fatigue and palpitations Gastrointestinal: negative for  abdominal pain and change in bowel habits Genitourinary:negative Integument/breast: negative for nipple discharge Musculoskeletal:negative for myalgias Neurological: negative for gait problems and tremors Behavioral/Psych: negative for abusive relationship, depression Endocrine: negative for temperature intolerance     Blood pressure 134/96, pulse 81, temperature 99.1 F (37.3 C), height 5' 7.5" (1.715 m), weight 262 lb (118.842 kg), last menstrual period 10/18/2014, unknown if currently breastfeeding.  Physical Exam Physical Exam:  Deferred  100% of 10 min visit spent on counseling and coordination of care.   Data Reviewed Labs Pathology Ultrasound  Assessment     Recent missed abortion in elderly multigravida.  Desires future fertility.      Plan    Referred to Reproductive Endocrinology for consultation and assistance. Continue PNV's  Orders Placed This Encounter  Procedures  . Ambulatory referral to Endocrinology    Referral Priority:  Routine    Referral Type:  Consultation    Referral Reason:  Specialty Services Required    Number of Visits Requested:  1   No orders of the defined types were placed in this encounter.

## 2014-10-21 NOTE — Addendum Note (Signed)
Addended by: Henriette Combs on: 10/21/2014 04:31 PM   Modules accepted: Orders

## 2014-10-28 ENCOUNTER — Other Ambulatory Visit: Payer: Self-pay | Admitting: Obstetrics

## 2014-10-28 ENCOUNTER — Telehealth: Payer: Self-pay

## 2014-10-28 DIAGNOSIS — Z1231 Encounter for screening mammogram for malignant neoplasm of breast: Secondary | ICD-10-CM

## 2014-10-28 NOTE — Telephone Encounter (Signed)
PATIENT HAS APPT WITH DR. Fermin SchwabAMER YALCINKAYA ON 11/09/14 AT 1PM PER ROBIN AT [facility] - CALLED PATIENT TO LET HER KNOW

## 2014-11-19 ENCOUNTER — Ambulatory Visit (HOSPITAL_COMMUNITY): Payer: BC Managed Care – PPO

## 2014-11-30 ENCOUNTER — Other Ambulatory Visit: Payer: Self-pay | Admitting: *Deleted

## 2014-11-30 DIAGNOSIS — Z3169 Encounter for other general counseling and advice on procreation: Secondary | ICD-10-CM

## 2014-11-30 MED ORDER — VITAFOL-NANO 18-0.6-0.4 MG PO TABS: 1.0000 | ORAL_TABLET | Freq: Every day | ORAL | Status: DC

## 2015-01-06 ENCOUNTER — Encounter: Payer: Self-pay | Admitting: *Deleted

## 2015-01-06 ENCOUNTER — Ambulatory Visit (INDEPENDENT_AMBULATORY_CARE_PROVIDER_SITE_OTHER): Payer: BC Managed Care – PPO | Admitting: Certified Nurse Midwife

## 2015-01-06 ENCOUNTER — Telehealth: Payer: Self-pay

## 2015-01-06 ENCOUNTER — Encounter: Payer: Self-pay | Admitting: Certified Nurse Midwife

## 2015-01-06 ENCOUNTER — Telehealth (HOSPITAL_COMMUNITY): Payer: Self-pay | Admitting: Obstetrics

## 2015-01-06 ENCOUNTER — Encounter: Payer: Self-pay | Admitting: Obstetrics

## 2015-01-06 VITALS — BP 138/83 | HR 95 | Temp 98.0°F | Wt 264.0 lb

## 2015-01-06 DIAGNOSIS — O09521 Supervision of elderly multigravida, first trimester: Secondary | ICD-10-CM

## 2015-01-06 DIAGNOSIS — O0991 Supervision of high risk pregnancy, unspecified, first trimester: Secondary | ICD-10-CM

## 2015-01-06 LAB — POCT URINALYSIS DIPSTICK
BILIRUBIN UA: NEGATIVE
Blood, UA: NEGATIVE
Glucose, UA: NEGATIVE
KETONES UA: NEGATIVE
Leukocytes, UA: NEGATIVE
Nitrite, UA: NEGATIVE
PH UA: 6.5
PROTEIN UA: NEGATIVE
SPEC GRAV UA: 1.015
Urobilinogen, UA: 1

## 2015-01-06 NOTE — Telephone Encounter (Signed)
MFM HAD APPT FOR U/S TOMORROW, AND PATIENT COULD NOT DO IT - MFM IS BOOKED OUT UNITL 01/19/15 FOR TUBAL TRANSLUCENCY - WERE TRYING TO GET HER IN BEFORE 14 WKS - PATIENT CHECKING HER SCH TO LET us KNOW WHEN SHE CAN DO IT- STATED SHE COULD NOT GET OFF WORK - THEY HAD NO APPTS FOR TODAY.

## 2015-01-06 NOTE — Progress Notes (Signed)
Subjective:    Gina Banks is being seen today for her first obstetrical visit.  This is a planned pregnancy. She is at [redacted]w[redacted]d gestation. Her obstetrical history is significant for advanced maternal age and stress induced hypertension, hx of gastric bypass surgery in 2007. Relationship with FOB: spouse, living together. Patient does intend to breast feed. Pregnancy history fully reviewed.  Had been to [facility]. Did not take Clomid, just progesterone to get pregnant.   Had early [place] at [facility].  Employed full-time as a Psychologist, forensic.  Encouraged flu shot.    The information documented in the HPI was reviewed and verified.  Menstrual History: OB History    Gravida Para Term Preterm AB TAB SAB Ectopic Multiple Living   5 1 1  3 1 2   1       Menarche age: 42  No LMP recorded. Patient is pregnant.    Past Medical History  Diagnosis Date  . Menopause present   . Gastric bypass status for obesity   . Cyst of breast   . Hx of cholecystectomy   . Hypertension     Past Surgical History  Procedure Laterality Date  . Cholecystectomy    . Gastric bypass    . Breast cyst excision    . Fallopian tube cyst    . Cyst removal neck Right   . Dilation and curettage of uterus N/A 01/28/2014    Procedure: DILATATION AND CURETTAGE SUCTION WITH CHROMOSOME STUDIES;  Surgeon: Antionette Char, MD;  Location: [facility];  Service: Gynecology;  Laterality: N/A;     (Not in a hospital admission) No Known Allergies  Social History  Substance Use Topics  . Smoking status: Never Smoker   . Smokeless tobacco: Never Used  . Alcohol Use: No    Family History  Problem Relation Age of Onset  . Heart attack Paternal Grandfather   . Diabetes Father   . Hypertension Father   . Cancer Mother      Review of Systems Constitutional: negative for weight loss Gastrointestinal: + for vomiting Genitourinary:negative for genital lesions and vaginal discharge and  dysuria Musculoskeletal:negative for back pain Behavioral/Psych: negative for abusive relationship, depression, illegal drug usage and tobacco use    Objective:    BP 138/83 mmHg  Pulse 95  Temp(Src) 98 F (36.7 C)  Wt 264 lb (119.75 kg) General Appearance:    Alert, cooperative, no distress, appears stated age  Head:    Normocephalic, without obvious abnormality, atraumatic  Eyes:    PERRL, conjunctiva/corneas clear, EOM's intact, fundi    benign, both eyes  Ears:    Normal TM's and external ear canals, both ears  Nose:   Nares normal, septum midline, mucosa normal, no drainage    or sinus tenderness  Throat:   Lips, mucosa, and tongue normal; teeth and gums normal  Neck:   Supple, symmetrical, trachea midline, no adenopathy;    thyroid:  no enlargement/tenderness/nodules; no carotid   bruit or JVD  Back:     Symmetric, no curvature, ROM normal, no CVA tenderness  Lungs:     Clear to auscultation bilaterally, respirations unlabored  Chest Wall:    No tenderness or deformity   Heart:    Regular rate and rhythm, S1 and S2 normal, no murmur, rub   or gallop  Breast Exam:    No tenderness, masses, or nipple abnormality  Abdomen:     Soft, non-tender, bowel sounds active all four quadrants,  no masses, no organomegaly  Genitalia:    Normal female without lesion, discharge or tenderness  Extremities:   Extremities normal, atraumatic, no cyanosis or edema  Pulses:   2+ and symmetric all extremities  Skin:   Skin color, texture, turgor normal, no rashes or lesions  Lymph nodes:   Cervical, supraclavicular, and axillary nodes normal  Neurologic:   CNII-XII intact, normal strength, sensation and reflexes    throughout     Cervix:  Long, thick, closed and posterior     Fetal heart activity and fetal activity observed with bedside US    Fundal height less than U  Lab Review Urine pregnancy test Labs reviewed yes Radiologic studies reviewed yes Assessment:    Pregnancy at [redacted]w[redacted]d  weeks   Doing well. High risk: hx of elevated B/P and AMA  Plan:      Prenatal vitamins.  Counseling provided regarding continued use of seat belts, cessation of alcohol consumption, smoking or use of illicit drugs; infection precautions i.e., influenza/TDAP immunizations, toxoplasmosis,CMV, parvovirus, listeria and varicella; workplace safety, exercise during pregnancy; routine dental care, safe medications, sexual activity, hot tubs, saunas, pools, travel, caffeine use, fish and methlymercury, potential toxins, hair treatments, varicose veins Weight gain recommendations per IOM guidelines reviewed: underweight/BMI< 18.5--> gain 28 - 40 lbs; normal weight/BMI 18.5 - 24.9--> gain 25 - 35 lbs; overweight/BMI 25 - 29.9--> gain 15 - 25 lbs; obese/BMI >30->gain  11 - 20 lbs Problem list reviewed and updated. FIRST/CF mutation testing/NIPT/QUAD SCREEN/fragile X/Ashkenazi Jewish population testing/Spinal muscular atrophy discussed: requested. Role of ultrasound in pregnancy discussed; fetal survey: requested. Amniocentesis discussed: not indicated. VBAC calculator score: VBAC consent form provided Meds ordered this encounter  Medications  . Doxylamine-Pyridoxine (DICLEGIS) 10-10 MG TBEC    Sig: Take by mouth.   Orders Placed This Encounter  Procedures  . Culture, OB Urine  . SureSwab, Vaginosis/Vaginitis Plus  . Obstetric panel  . HIV antibody  . Hemoglobinopathy evaluation  . Varicella zoster antibody, IgG  . Vit D  25 hydroxy (rtn osteoporosis monitoring)  . AMB referral to maternal fetal medicine    Referral Priority:  Urgent    Referral Type:  Consultation    Number of Visits Requested:  1  . POCT urinalysis dipstick    Follow up in 4 weeks. 50% of 30 min visit spent on counseling and coordination of care.

## 2015-01-06 NOTE — Telephone Encounter (Signed)
PATIENT'S BCBS INSURANCE WILL COVER HER PROFESSIONAL GLOBAL MATERNITY BENEFITS FOR [facility] - TOLD HER TO [facility] FOR THEIR COVERAGE, ALSO TO LET us KNOW ABOUT [facility] MFM APPTS.

## 2015-01-07 LAB — OBSTETRIC PANEL
ANTIBODY SCREEN: NEGATIVE
BASOS ABS: 0 10*3/uL (ref 0.0–0.1)
Basophils Relative: 1 % (ref 0–1)
EOS PCT: 1 % (ref 0–5)
Eosinophils Absolute: 0 10*3/uL (ref 0.0–0.7)
HCT: 34.9 % — ABNORMAL LOW (ref 36.0–46.0)
Hemoglobin: 11.3 g/dL — ABNORMAL LOW (ref 12.0–15.0)
Hepatitis B Surface Ag: NEGATIVE
LYMPHS ABS: 1.2 10*3/uL (ref 0.7–4.0)
LYMPHS PCT: 27 % (ref 12–46)
MCH: 27.3 pg (ref 26.0–34.0)
MCHC: 32.4 g/dL (ref 30.0–36.0)
MCV: 84.3 fL (ref 78.0–100.0)
MPV: 10.6 fL (ref 8.6–12.4)
Monocytes Absolute: 0.8 10*3/uL (ref 0.1–1.0)
Monocytes Relative: 18 % — ABNORMAL HIGH (ref 3–12)
Neutro Abs: 2.3 10*3/uL (ref 1.7–7.7)
Neutrophils Relative %: 53 % (ref 43–77)
PLATELETS: 325 10*3/uL (ref 150–400)
RBC: 4.14 MIL/uL (ref 3.87–5.11)
RDW: 15.2 % (ref 11.5–15.5)
RUBELLA: 9.85 {index} — AB (ref <0.90)
Rh Type: POSITIVE
WBC: 4.3 10*3/uL (ref 4.0–10.5)

## 2015-01-07 LAB — HIV ANTIBODY (ROUTINE TESTING W REFLEX): HIV: NONREACTIVE

## 2015-01-07 LAB — VARICELLA ZOSTER ANTIBODY, IGG: Varicella IgG: 1575 Index — ABNORMAL HIGH (ref <135.00)

## 2015-01-07 LAB — VITAMIN D 25 HYDROXY (VIT D DEFICIENCY, FRACTURES): VIT D 25 HYDROXY: 25 ng/mL — AB (ref 30–100)

## 2015-01-08 ENCOUNTER — Other Ambulatory Visit: Payer: Self-pay | Admitting: Certified Nurse Midwife

## 2015-01-08 ENCOUNTER — Telehealth: Payer: Self-pay

## 2015-01-08 DIAGNOSIS — Z3A13 13 weeks gestation of pregnancy: Secondary | ICD-10-CM

## 2015-01-08 DIAGNOSIS — Z3682 Encounter for antenatal screening for nuchal translucency: Secondary | ICD-10-CM

## 2015-01-08 LAB — HEMOGLOBINOPATHY EVALUATION
HGB F QUANT: 0 % (ref 0.0–2.0)
Hemoglobin Other: 0 %
Hgb A2 Quant: 2.7 % (ref 2.2–3.2)
Hgb A: 97.3 % (ref 96.8–97.8)
Hgb S Quant: 0 %

## 2015-01-08 LAB — CULTURE, OB URINE: Colony Count: 55000

## 2015-01-08 LAB — PAP, TP IMAGING W/ HPV RNA, RFLX HPV TYPE 16,18/45: HPV mRNA, High Risk: NOT DETECTED

## 2015-01-08 NOTE — Telephone Encounter (Signed)
CALLED PATIENT TO LET HER KNOW OF APPTS AT [facility] AT 9 AND 11AM, TOLD HER TO CALL us OR MFM BACK TO CONFIRM

## 2015-01-09 LAB — SURESWAB, VAGINOSIS/VAGINITIS PLUS
Atopobium vaginae: NOT DETECTED Log (cells/mL)
C. ALBICANS, DNA: NOT DETECTED
C. PARAPSILOSIS, DNA: NOT DETECTED
C. TRACHOMATIS RNA, TMA: NOT DETECTED
C. TROPICALIS, DNA: NOT DETECTED
C. glabrata, DNA: NOT DETECTED
GARDNERELLA VAGINALIS: NOT DETECTED Log (cells/mL)
LACTOBACILLUS SPECIES: 7.2 Log (cells/mL)
MEGASPHAERA SPECIES: NOT DETECTED Log (cells/mL)
N. gonorrhoeae RNA, TMA: NOT DETECTED
T. VAGINALIS RNA, QL TMA: NOT DETECTED

## 2015-01-12 ENCOUNTER — Ambulatory Visit (HOSPITAL_COMMUNITY): Payer: BC Managed Care – PPO | Attending: Certified Nurse Midwife

## 2015-01-12 ENCOUNTER — Other Ambulatory Visit: Payer: Self-pay | Admitting: Certified Nurse Midwife

## 2015-01-12 ENCOUNTER — Encounter (HOSPITAL_COMMUNITY): Payer: BC Managed Care – PPO

## 2015-01-12 DIAGNOSIS — O2342 Unspecified infection of urinary tract in pregnancy, second trimester: Secondary | ICD-10-CM

## 2015-01-12 MED ORDER — NITROFURANTOIN MONOHYD MACRO 100 MG PO CAPS: 100.0000 mg | ORAL_CAPSULE | Freq: Two times a day (BID) | ORAL | Status: AC

## 2015-02-03 ENCOUNTER — Ambulatory Visit (INDEPENDENT_AMBULATORY_CARE_PROVIDER_SITE_OTHER): Payer: BC Managed Care – PPO | Admitting: Certified Nurse Midwife

## 2015-02-03 VITALS — BP 125/78 | HR 92 | Temp 98.8°F | Wt 265.0 lb

## 2015-02-03 DIAGNOSIS — O0992 Supervision of high risk pregnancy, unspecified, second trimester: Secondary | ICD-10-CM

## 2015-02-03 DIAGNOSIS — O219 Vomiting of pregnancy, unspecified: Secondary | ICD-10-CM

## 2015-02-03 LAB — POCT URINALYSIS DIPSTICK
Bilirubin, UA: NEGATIVE
GLUCOSE UA: NEGATIVE
Ketones, UA: NEGATIVE
Leukocytes, UA: NEGATIVE
NITRITE UA: NEGATIVE
PH UA: 5
RBC UA: NEGATIVE
Spec Grav, UA: 1.025
UROBILINOGEN UA: NEGATIVE

## 2015-02-03 MED ORDER — ONDANSETRON HCL 4 MG PO TABS: 4.0000 mg | ORAL_TABLET | Freq: Every day | ORAL | Status: DC | PRN

## 2015-02-03 NOTE — Progress Notes (Signed)
  Subjective:    Gina Banks is a 39 y.o. female being seen today for her obstetrical visit. She is at [redacted]w[redacted]d gestation. Patient reports: nausea, no bleeding, no contractions, no cramping and no leaking.  Problem List Items Addressed This Visit    None    Visit Diagnoses    Supervision of high risk pregnancy in second trimester    -  Primary    Relevant Orders    POCT urinalysis dipstick (Completed)    AFP, Quad Screen    [place] MFM OB COMP + 14 WK    AMB Referral to Maternal Fetal Medicine (MFM)    Nausea and vomiting in pregnancy prior to [redacted] weeks gestation        Relevant Medications    ondansetron (ZOFRAN) 4 MG tablet      Patient Active Problem List   Diagnosis Date Noted  . Superficial fungus infection of skin 03/16/2014  . Candida vaginitis 03/16/2014  . Missed abortion 01/24/2014  . Elderly multigravida 01/22/2014  . Unspecified vitamin D deficiency 06/07/2013  . Obesity complicating pregnancy 06/28/2009    Objective:     BP 125/78 mmHg  Pulse 92  Temp(Src) 98.8 F (37.1 C)  Wt 265 lb (120.203 kg) Uterine Size: Below umbilicus   FHR: 155  Assessment:    Pregnancy @ [redacted]w[redacted]d  weeks Doing well    Plan:    Problem list reviewed and updated. Labs reviewed.  Follow up in 4 weeks. FIRST/CF mutation testing/NIPT/QUAD SCREEN/fragile X/Ashkenazi Jewish population testing/Spinal muscular atrophy discussed: ordered. Role of ultrasound in pregnancy discussed; fetal survey: ordered. Amniocentesis discussed: not indicated. 50% of 15 minute visit spent on counseling and coordination of care.

## 2015-02-04 LAB — AFP, QUAD SCREEN
AFP: 18.2 ng/mL
Age Alone: 1:75 {titer}
CURR GEST AGE: 16.4 wks.days
Down Syndrome Scr Risk Est: 1:10 {titer}
HCG, Total: 50.96 IU/mL
INH: 260.5 pg/mL
Interpretation-AFP: POSITIVE — AB
MOM FOR INH: 2.05
MoM for AFP: 0.68
MoM for hCG: 1.92
Open Spina bifida: NEGATIVE
Tri 18 Scr Risk Est: NEGATIVE
Trisomy 18 (Edward) Syndrome Interp.: 1:3780 {titer}
UE3 MOM: 0.88
uE3 Value: 0.72 ng/mL

## 2015-02-05 ENCOUNTER — Other Ambulatory Visit: Payer: Self-pay | Admitting: Certified Nurse Midwife

## 2015-02-05 DIAGNOSIS — R772 Abnormality of alphafetoprotein: Secondary | ICD-10-CM

## 2015-02-16 ENCOUNTER — Telehealth: Payer: Self-pay | Admitting: Certified Nurse Midwife

## 2015-02-16 ENCOUNTER — Other Ambulatory Visit: Payer: Self-pay | Admitting: Certified Nurse Midwife

## 2015-02-16 NOTE — Telephone Encounter (Signed)
Spoke with patient regarding elevated AFP results.  Patient scheduled for 11/2 @MFM  for consultation.  Patient verbalized understanding.

## 2015-03-03 ENCOUNTER — Ambulatory Visit (HOSPITAL_COMMUNITY)
Admission: RE | Admit: 2015-03-03 | Discharge: 2015-03-03 | Disposition: A | Discharge status: Home / Self Care | Payer: BC Managed Care – PPO | Source: Ambulatory Visit | Attending: Certified Nurse Midwife | Admitting: Certified Nurse Midwife

## 2015-03-03 ENCOUNTER — Ambulatory Visit (HOSPITAL_COMMUNITY)
Admission: RE | Admit: 2015-03-03 | Discharge: 2015-03-03 | Disposition: A | Discharge status: Home / Self Care | Payer: BC Managed Care – PPO | Source: Ambulatory Visit

## 2015-03-03 ENCOUNTER — Encounter: Payer: BC Managed Care – PPO | Admitting: Certified Nurse Midwife

## 2015-03-03 ENCOUNTER — Other Ambulatory Visit: Payer: Self-pay | Admitting: Certified Nurse Midwife

## 2015-03-03 DIAGNOSIS — O99842 Bariatric surgery status complicating pregnancy, second trimester: Secondary | ICD-10-CM | POA: Diagnosis not present

## 2015-03-03 DIAGNOSIS — O09522 Supervision of elderly multigravida, second trimester: Secondary | ICD-10-CM

## 2015-03-03 DIAGNOSIS — O10019 Pre-existing essential hypertension complicating pregnancy, unspecified trimester: Secondary | ICD-10-CM | POA: Insufficient documentation

## 2015-03-03 DIAGNOSIS — O0992 Supervision of high risk pregnancy, unspecified, second trimester: Secondary | ICD-10-CM

## 2015-03-03 DIAGNOSIS — O283 Abnormal ultrasonic finding on antenatal screening of mother: Secondary | ICD-10-CM | POA: Diagnosis not present

## 2015-03-03 DIAGNOSIS — O99212 Obesity complicating pregnancy, second trimester: Secondary | ICD-10-CM | POA: Insufficient documentation

## 2015-03-03 DIAGNOSIS — Z3A20 20 weeks gestation of pregnancy: Secondary | ICD-10-CM

## 2015-03-03 DIAGNOSIS — O352XX0 Maternal care for (suspected) hereditary disease in fetus, not applicable or unspecified: Secondary | ICD-10-CM

## 2015-03-04 ENCOUNTER — Other Ambulatory Visit: Payer: Self-pay | Admitting: Certified Nurse Midwife

## 2015-03-04 DIAGNOSIS — O09529 Supervision of elderly multigravida, unspecified trimester: Secondary | ICD-10-CM | POA: Insufficient documentation

## 2015-03-04 DIAGNOSIS — O352XX0 Maternal care for (suspected) hereditary disease in fetus, not applicable or unspecified: Secondary | ICD-10-CM | POA: Insufficient documentation

## 2015-03-04 DIAGNOSIS — Z3A20 20 weeks gestation of pregnancy: Secondary | ICD-10-CM | POA: Insufficient documentation

## 2015-03-04 NOTE — Progress Notes (Signed)
DOB: [birth year] Referring Provider: Roe Coombsenney, Rachelle A, CNM Appointment Date: 03/03/2015 Attending: Dr. Particia NearingMartha Banks  Mrs. Gina ChangKimberly T Banks was seen for genetic counseling regarding a maternal age of 39 y.o. and an increased risk for Down syndrome based on a maternal serum Quad screen.  In summary:  Discussed maternal age of 39 and association with aneuploidy  Discussed increased risk for Down syndrome based on maternal serum Quad screen  Discussed options for additional screening or diagnostic testing  Declined additional testing or screening at this time, may consider NIPS at a later gestation  Discussed patient's carrier status for alpha thalassemia  Will have carrier testing for husband performed  She was counseled regarding maternal age and the association with risk for chromosome conditions due to nondisjunction with aging of the ova.   We reviewed chromosomes, nondisjunction, and the associated 1 in 2942 risk for fetal aneuploidy related to a maternal age of 39 y.o. at 7020w5d gestation.  She was counseled that the risk for aneuploidy decreases as gestational age increases, accounting for those pregnancies which spontaneously abort.  We specifically discussed Down syndrome (trisomy 2921), trisomies 6613 and 1518, and sex chromosome aneuploidies (47,XXX and 47,XXY) including the common features and prognoses of each.   We also reviewed Gina Banks's maternal serum Quad screen result and the associated increase in risk for fetal Down syndrome ( from 1 in 75 to 1 in 10).  She was counseled regarding other explanations for a screen positive result including normal variation and differences in maternal metabolism.  In addition, we reviewed the screen adjusted reduction in risks for trisomy 18 and ONTDs.  She understands that Quad screening provides a pregnancy specific risk for Down syndrome, but is not considered to be diagnostic.  We also reviewed Gina Banks's ultrasound.  We  discussed that 50-80% of fetuses with Down syndrome and up to 90% of fetuses with Trisomy 13 or 18, will have detectable anomalies or soft markers of aneuploidy on second trimester ultrasound.  Gina Banks had a targeted anatomy ultrasound today, the report of which will be sent under separate cover.  We discussed that no fetal anomalies or soft markers were identified on today's ultrasound.  We reviewed other available screening options, specifically noninvasive prenatal screening (NIPS)/cell free DNA (cfDNA) testing.   We reviewed the benefits and limitations of this option. Specifically, we discussed the conditions for which each test screens, the detection rates, and false positive rates of each. She was also counseled regarding diagnostic testing via amniocentesis. We reviewed the approximate 1 in 300-500 risk for complications from amniocentesis, including spontaneous pregnancy loss. After consideration of all the options, she declined NIPS for now.  She understands that this screening option is available at any time during pregnancy.  Both family histories were reviewed. 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   Carrier screening for alpha thalassemia has not yet been performed for her husband.    We reviewed genes, chromosomes, and autosomal recessive inheritance. Alpha thalassemia is different in its inheritance compared to other hemoglobinopathies as there are two alpha globin genes on each chromosome 16 (aa/aa).  A person can be a carrier of one alpha gene mutation (aa/a-), also referred to as a "silent carrier" or of more than one mutation.  A person who carriers two alpha globin gene mutations can either carry them in cis (both on the same chromosome, denoted as aa/--) or in trans (on different chromosomes,  denoted as a-/a-) .  We discussed that alpha thalassemia carriers of two mutations who have African ancestry  are more likely to have a trans arrangements (a-/a-), and individuals with Asian ancestry who are alpha thalassemia carriers usually have a cis arrangement (aa/--) of their alpha globin gene mutations. The cis configuration is rarely reported in individuals with African ancestry. Given that Gina Banks has no known family history of alpha thalassemia and consanguinity has been denied, he would have the general population risk to be an alpha thalassemia carrier.    We discussed different forms of alpha thalassemia. The most severe form of alpha thalassemia, hydrops fetalis with Hb Barts, is associated with an absence of alpha globin chain synthesis as a result of deletions of all four alpha globin genes (--/--).  Given that Gina Banks is a silent carrier (aa/a-), her pregnancies would not be at increased risk for hemoglobin Bart hydrops fetalis, even if her husband is a carrier for alpha thalassemia. Hemoglobin H (HbH) disease is caused by three deleted or dysfunctioning alpha globin alleles (a-/--) and is characterized by microcytic hypochromic hemolytic anemia, hepatosplenomegaly, mild jaundice, and sometimes thalassemia-like bone changes. Given Gina Banks's silent carrier status (aa/a-), the current pregnancy would only be at risk for Hemoglobin H disease (a-/--), if her husband were to be a carrier for two alpha globin mutations in cis (aa/--), in which case the risk for hemoglobin H disease in the pregnancy would be 1 in 4 (25%).   If Gina Banks is a carrier for two alpha globin mutations, we discussed that he would be more likely to carry them in trans configuration (a-/a-), given his ethnicity. If he is a carrier of alpha thalassemia in trans, then the pregnancy would not be at risk for alpha thalassemia (hemoglobin H disease), but would have a 1 in 2 (50%) chance to carry alpha thalassemia in trans (a-/a-) and a 1 in 2 (50%) chance to be a silent carrier (aa/a-). If Gina Banks is also  a silent carrier for alpha thalassemia (aa/a-), then the pregnancy would not be at risk for alpha thalassemia, but would have a 1 in 4 (25%) chance to be an alpha thalassemia carrier in trans (a-/a-), a 1 in 2 (50%) chance to be a silent alpha thalassemia carrier (aa/a-), and a 1 in 4 (25%) chance to not be a carrier. If Gina Banks is not a carrier for alpha thalassemia, then the pregnancy would have a 1 in 2 (50%) chance to be a silent carrier, like Gina Banks. Gina Banks will discuss this with her husband and arrange for him to have screening.  We would be happy to meet with them again should his results suggest that he is also a carrier.  The remainder of the family history was reviewed and found to be noncontributory for birth defects, intellectual disability, and known genetic conditions. Without further information regarding the provided family history, an accurate genetic risk cannot be calculated. Further genetic counseling is warranted if more information is obtained.  Gina Banks denied exposure to environmental toxins or chemical agents. She denied the use of alcohol, tobacco or street drugs. She denied significant viral illnesses during the course of her pregnancy. Her medical and surgical histories were noncontributory.     I counseled this couple regarding the above risks and available options.  The approximate face-to-face time with the genetic counselor was 25 minutes.    Mady Gemma, MS  Certified Genetic Counselor

## 2015-03-09 ENCOUNTER — Ambulatory Visit (INDEPENDENT_AMBULATORY_CARE_PROVIDER_SITE_OTHER): Payer: BC Managed Care – PPO | Admitting: Certified Nurse Midwife

## 2015-03-09 ENCOUNTER — Encounter: Payer: Self-pay | Admitting: Obstetrics

## 2015-03-09 VITALS — BP 122/74 | HR 104 | Temp 98.4°F | Wt 266.0 lb

## 2015-03-09 DIAGNOSIS — O0992 Supervision of high risk pregnancy, unspecified, second trimester: Secondary | ICD-10-CM

## 2015-03-09 LAB — POCT URINALYSIS DIPSTICK
Bilirubin, UA: NEGATIVE
GLUCOSE UA: NEGATIVE
Ketones, UA: NEGATIVE
Leukocytes, UA: NEGATIVE
NITRITE UA: NEGATIVE
PH UA: 5
PROTEIN UA: NEGATIVE
RBC UA: NEGATIVE
Spec Grav, UA: 1.015
UROBILINOGEN UA: NEGATIVE

## 2015-03-09 NOTE — Progress Notes (Signed)
Subjective:    Gina Banks is a 39 y.o. female being seen today for her obstetrical visit. She is at 7651w3d gestation. Patient reports: no complaints . Fetal movement: normal.  States that the N&V is gone.    Problem List Items Addressed This Visit    None    Visit Diagnoses    Supervision of high risk pregnancy in second trimester    -  Primary    Relevant Orders    POCT urinalysis dipstick      Patient Active Problem List   Diagnosis Date Noted  . [redacted] weeks gestation of pregnancy   . Advanced maternal age in multigravida   . Hereditary disease in family possibly affecting fetus, affecting management of mother, antepartum condition or complication   . Superficial fungus infection of skin 03/16/2014  . Candida vaginitis 03/16/2014  . Missed abortion 01/24/2014  . Elderly multigravida 01/22/2014  . Unspecified vitamin D deficiency 06/07/2013  . Obesity complicating pregnancy 06/28/2009   Objective:    BP 122/74 mmHg  Pulse 104  Temp(Src) 98.4 F (36.9 C)  Wt 266 lb (120.657 kg) FHT: 150 BPM  Uterine Size: size equals dates     Assessment:    Pregnancy @ 5951w3d    Doing well  Plan:    OBGCT: discussed. Signs and symptoms of preterm labor: discussed.  Labs, problem list reviewed and updated 2 hr GTT planned Follow up in 4 weeks.

## 2015-03-10 ENCOUNTER — Other Ambulatory Visit: Payer: Self-pay | Admitting: *Deleted

## 2015-03-10 DIAGNOSIS — O09513 Supervision of elderly primigravida, third trimester: Secondary | ICD-10-CM

## 2015-03-31 ENCOUNTER — Other Ambulatory Visit: Payer: Self-pay | Admitting: Certified Nurse Midwife

## 2015-04-03 ENCOUNTER — Inpatient Hospital Stay (HOSPITAL_COMMUNITY)
Admission: AD | Admit: 2015-04-03 | Discharge: 2015-04-03 | Disposition: A | Discharge status: Home / Self Care | Payer: BC Managed Care – PPO | Source: Ambulatory Visit | Attending: Obstetrics | Admitting: Obstetrics

## 2015-04-03 ENCOUNTER — Encounter (HOSPITAL_COMMUNITY): Payer: Self-pay | Admitting: *Deleted

## 2015-04-03 DIAGNOSIS — Z3A25 25 weeks gestation of pregnancy: Secondary | ICD-10-CM | POA: Insufficient documentation

## 2015-04-03 DIAGNOSIS — Z9884 Bariatric surgery status: Secondary | ICD-10-CM | POA: Diagnosis not present

## 2015-04-03 DIAGNOSIS — O2242 Hemorrhoids in pregnancy, second trimester: Secondary | ICD-10-CM | POA: Diagnosis not present

## 2015-04-03 DIAGNOSIS — K644 Residual hemorrhoidal skin tags: Secondary | ICD-10-CM

## 2015-04-03 LAB — URINALYSIS, ROUTINE W REFLEX MICROSCOPIC
Bilirubin Urine: NEGATIVE
Glucose, UA: NEGATIVE mg/dL
Hgb urine dipstick: NEGATIVE
Ketones, ur: NEGATIVE mg/dL
Leukocytes, UA: NEGATIVE
Nitrite: NEGATIVE
Protein, ur: NEGATIVE mg/dL
Specific Gravity, Urine: 1.015 (ref 1.005–1.030)
pH: 6.5 (ref 5.0–8.0)

## 2015-04-03 MED ORDER — LIDOCAINE-HYDROCORTISONE ACE 2.8-0.55 % RE GEL: 1.0000 | Freq: Two times a day (BID) | RECTAL | Status: AC

## 2015-04-03 NOTE — MAU Provider Note (Signed)
  History     CSN: [id number]  Arrival date and time: 04/03/15 [id number]   First Provider Initiated Contact with Patient 04/03/15 1010      Chief Complaint  Patient presents with  . Hemorrhoids   HPI  Gina Banks 39 y.o. [id number] 2763w0d presents to the MAU with c/o of hemmorhoid pain and has not been able to sleep for the past 3 nights. She has tried tucks, anusul cream. She denies vaginal bleeding, LOF, contractions. Reports positive fetal movement  Past Medical History  Diagnosis Date  . Menopause present   . Gastric bypass status for obesity   . Cyst of breast   . Hx of cholecystectomy   . Hypertension     Past Surgical History  Procedure Laterality Date  . Cholecystectomy    . Gastric bypass    . Breast cyst excision    . Fallopian tube cyst    . Cyst removal neck Right   . Dilation and curettage of uterus N/A 01/28/2014    Procedure: DILATATION AND CURETTAGE SUCTION WITH CHROMOSOME STUDIES;  Surgeon: Antionette CharLisa Jackson-Moore, MD;  Location: [facility];  Service: Gynecology;  Laterality: N/A;    Family History  Problem Relation Age of Onset  . Heart attack Paternal Grandfather   . Diabetes Father   . Hypertension Father   . Cancer Mother     Social History  Substance Use Topics  . Smoking status: Never Smoker   . Smokeless tobacco: Never Used  . Alcohol Use: No    Allergies: No Known Allergies  Prescriptions prior to admission  Medication Sig Dispense Refill Last Dose  . Prenatal-Fe Fum-Methf-FA w/o A (VITAFOL-NANO) 18-0.6-0.4 MG TABS Take 1 tablet by mouth daily. 30 tablet 11 04/02/2015 at Unknown time  . ondansetron (ZOFRAN) 4 MG tablet Take 1 tablet (4 mg total) by mouth daily as needed. 30 tablet 4 prn    Review of Systems  Constitutional: Negative for fever.  Gastrointestinal: Negative for diarrhea and constipation.       Hemorrhoid pain  All other systems reviewed and are negative.  Physical Exam   Blood pressure 130/79, pulse 93, temperature 98.2  F (36.8 C), temperature source Oral, resp. rate 16, height 5' 7.5" (1.715 m), weight 120.657 kg (266 lb), unknown if currently breastfeeding.  Physical Exam  Nursing note and vitals reviewed. Constitutional: She is oriented to person, place, and time. She appears well-developed and well-nourished.  HENT:  Head: Normocephalic and atraumatic.  Neck: Normal range of motion.  Cardiovascular: Normal rate.   Respiratory: Effort normal. No respiratory distress.  GI: Soft. There is no tenderness.  Genitourinary: Rectal exam shows external hemorrhoid.  Musculoskeletal: Normal range of motion.  Neurological: She is alert and oriented to person, place, and time.  Skin: Skin is warm and dry.  Psychiatric: She has a normal mood and affect. Her behavior is normal. Judgment and thought content normal.    MAU Course  Procedures  MDM Suggested applying tucks pads to area and drink plenty of fluids,may take colace to avoid hard stools , can try Preparation H; FHR  CAT 1; no contractions; Will give percocet for pain relief and needs to follow up with Dr. Clearance CootsHarper to evaluate.  Assessment and Plan   External Hemmorrhoid- non thrombosed Hydrocortisone-Lidocaine Applicator RX  Discharge to home.   Gina Banks 04/03/2015, 10:24 AM

## 2015-04-03 NOTE — MAU Note (Signed)
Patient presents at [redacted] weeks gestation with c/o hemorrhoidal pain X 3 days. Fetus active. Denies bleeding or discharge.

## 2015-04-03 NOTE — Discharge Instructions (Signed)
Hemorrhoids °Hemorrhoids are swollen veins around the rectum or anus. There are two types of hemorrhoids:  °· Internal hemorrhoids. These occur in the veins just inside the rectum. They may poke through to the outside and become irritated and painful. °· External hemorrhoids. These occur in the veins outside the anus and can be felt as a painful swelling or hard lump near the anus. °CAUSES °· Pregnancy.   °· Obesity.   °· Constipation or diarrhea.   °· Straining to have a bowel movement.   °· Sitting for long periods on the toilet. °· Heavy lifting or other activity that caused you to strain. °· Anal intercourse. °SYMPTOMS  °· Pain.   °· Anal itching or irritation.   °· Rectal bleeding.   °· Fecal leakage.   °· Anal swelling.   °· One or more lumps around the anus.   °DIAGNOSIS  °Your caregiver may be able to diagnose hemorrhoids by visual examination. Other examinations or tests that may be performed include:  °· Examination of the rectal area with a gloved hand (digital rectal exam).   °· Examination of anal canal using a small tube (scope).   °· A blood test if you have lost a significant amount of blood. °· A test to look inside the colon (sigmoidoscopy or colonoscopy). °TREATMENT °Most hemorrhoids can be treated at home. However, if symptoms do not seem to be getting better or if you have a lot of rectal bleeding, your caregiver may perform a procedure to help make the hemorrhoids get smaller or remove them completely. Possible treatments include:  °· Placing a rubber band at the base of the hemorrhoid to cut off the circulation (rubber band ligation).   °· Injecting a chemical to shrink the hemorrhoid (sclerotherapy).   °· Using a tool to burn the hemorrhoid (infrared light therapy).   °· Surgically removing the hemorrhoid (hemorrhoidectomy).   °· Stapling the hemorrhoid to block blood flow to the tissue (hemorrhoid stapling).   °HOME CARE INSTRUCTIONS  °· Eat foods with fiber, such as whole grains, beans,  nuts, fruits, and vegetables. Ask your doctor about taking products with added fiber in them (fiber supplements). °· Increase fluid intake. Drink enough water and fluids to keep your urine clear or pale yellow.   °· Exercise regularly.   °· Go to the bathroom when you have the urge to have a bowel movement. Do not wait.   °· Avoid straining to have bowel movements.   °· Keep the anal area dry and clean. Use wet toilet paper or moist towelettes after a bowel movement.   °· Medicated creams and suppositories may be used or applied as directed.   °· Only take over-the-counter or prescription medicines as directed by your caregiver.   °· Take warm sitz baths for 15-20 minutes, 3-4 times a day to ease pain and discomfort.   °· Place ice packs on the hemorrhoids if they are tender and swollen. Using ice packs between sitz baths may be helpful.   °· Put ice in a plastic bag.   °· Place a towel between your skin and the bag.   °· Leave the ice on for 15-20 minutes, 3-4 times a day.   °· Do not use a donut-shaped pillow or sit on the toilet for long periods. This increases blood pooling and pain.   °SEEK MEDICAL CARE IF: °· You have increasing pain and swelling that is not controlled by treatment or medicine. °· You have uncontrolled bleeding. °· You have difficulty or you are unable to have a bowel movement. °· You have pain or inflammation outside the area of the hemorrhoids. °MAKE SURE YOU: °· Understand these instructions. °·   Will watch your condition.  Will get help right away if you are not doing well or get worse.   This information is not intended to replace advice given to you by your health care provider. Make sure you discuss any questions you have with your health care provider.   Document Released: 04/14/2000 Document Revised: 04/03/2012 Document Reviewed: 02/20/2012 Elsevier Interactive Patient Education 2016 [employer]  Nonsurgical Procedures for Hemorrhoids Nonsurgical procedures can be used to  treat hemorrhoids. Hemorrhoids are swollen veins that are inside the rectum (internal hemorrhoids) or around the anus (external hemorrhoids). They are caused by increased pressure in the anal area. This pressure may result from straining to have a bowel movement (constipation), diarrhea, pregnancy, obesity, anal sex, or sitting for long periods of time. Hemorrhoids can cause symptoms such as pain and bleeding. Various procedures may be performed if diet changes, lifestyle changes, and other treatments do not help your symptoms. Some of these procedures do not involve surgery. Three common nonsurgical procedures are:  Rubber band ligation. Rubber bands are used to cut off the blood supply to the hemorrhoids.  Sclerotherapy. Medicine is injected into the hemorrhoids to shrink them.  Infrared coagulation. A type of light energy is used to get rid of the hemorrhoids. LET [facility] LLC[facility]OUR HEALTH CARE PROVIDER KNOW ABOUT:  Any allergies you have.  All medicines you are taking, including vitamins, herbs, eye drops, creams, and over-the-counter medicines.  Previous problems you or members of your family have had with the use of anesthetics.  Any blood disorders you have.  Previous surgeries you have had.  Any medical conditions you have.  Whether you are pregnant or may be pregnant. RISKS AND COMPLICATIONS Generally, this is a safe procedure. However, problems may occur, including:  Infection.  Bleeding.  Pain. BEFORE THE PROCEDURE  Ask your health care provider about:  Changing or stopping your regular medicines. This is especially important if you are taking diabetes medicines or blood thinners.  Taking medicines such as aspirin and ibuprofen. These medicines can thin your blood. Do not take these medicines before your procedure if your health care provider instructs you not to.  You may need to have a procedure to examine the inside of your colon with a scope (colonoscopy). Your health care  provider may do this to make sure that there are no other causes for your bleeding or pain. PROCEDURE  Your health care provider will clean your rectal area with a rinsing solution.  A lubricating jelly may be placed into your rectum. The jelly may contain a medicine to numb the area (local anesthetic).  Your health care provider will insert a short scope (anoscope) into your rectum to examine the hemorrhoids.  One of the following techniques will be used. Rubber Band Ligation Your health care provider will place medical instruments through the scope to put rubber bands around the base of your hemorrhoids. The bands will cut off the blood supply to the hemorrhoids. The hemorrhoids will fall off after several days. Sclerotherapy Your health care provider will inject medicine through the scope into your hemorrhoids. This will cause them to shrink and dry up. Infrared Coagulation Your health care provider will shine a type of light through the scope onto your hemorrhoids. This light will generate energy (infrared radiation). It will cause the hemorrhoids to scar and then fall off. Each of these procedures may vary among health care providers and hospitals. AFTER THE PROCEDURE  You will be monitored to make sure that you  have no bleeding.  Return to your normal activities as told by your health care provider.   This information is not intended to replace advice given to you by your health care provider. Make sure you discuss any questions you have with your health care provider.   Document Released: 02/12/2009 Document Revised: 01/06/2015 Document Reviewed: 07/13/2014 Elsevier Interactive Patient Education 2016 [employer]. Hydrocortisone; Lidocaine rectal cream or gel What is this medicine? HYDROCORTISONE; LIDOCAINE (hye droe KOR ti sone; LYE doe kane) is a corticosteroid combined with an anesthetic pain reliever. It is used to decrease swelling, itching, and pain that is caused by minor  rectal irritation or hemorrhoids. This medicine may be used for other purposes; ask your health care provider or pharmacist if you have questions. What should I tell my health care provider before I take this medicine? They need to know if you have any of these conditions: -diabetes -large areas of burned or damaged skin -liver disease -skin infection (especially a virus infection such as chickenpox, cold sores, or herpes -skin wasting or thinning -tuberculosis -an unusual or allergic reaction to hydrocortisone, lidocaine, other medicines, foods, dyes, or preservatives -pregnant or trying to get pregnant -breast-feeding How should I use this medicine? This medicine is for rectal use only. Some products are applied to the skin surrounding the rectal area. Other products are gently inserted into the rectum. Follow the instructions that come in the package. Wash your hands before and after use. Use your medicine at regular intervals. Do not use it more often than directed. Talk to your pediatrician regarding the use of this medicine in children. Special care may be needed. Overdosage: If you think you have taken too much of this medicine contact a poison control center or emergency room at once. NOTE: This medicine is only for you. Do not share this medicine with others. What if I miss a dose? If you miss a dose, use it as soon as you can. If it is almost time for your next dose, use only that dose. Do not use double or extra doses. What may interact with this medicine? -certain medicines for irregular heart beat Do not use any other skin products on the affected area without asking your doctor or health care professional. This list may not describe all possible interactions. Give your health care provider a list of all the medicines, herbs, non-prescription drugs, or dietary supplements you use. Also tell them if you smoke, drink alcohol, or use illegal drugs. Some items may interact with your  medicine. What should I watch for while using this medicine? Tell your doctor or health care professional if your symptoms do not start to get better or if they get worse. Do not get this medicine in your eyes. If you do, rinse out with plenty of cool tap water. This medicine can make certain skin conditions worse. Only use it for conditions for which your doctor or health care professional has prescribed. Be careful to avoid injury to the treated area while the area is numb and you are not aware of pain. What side effects may I notice from receiving this medicine? Side effects that you should report to your doctor or health care professional as soon as possible: -allergic reactions like skin rash, itching or hives, swelling of the face, lips, or tongue -burning feeling on the skin -confusion -dizziness -infection -lack of healing of skin conditions -palpitations -tremor Side effects that usually do not require medical attention (Report these to your doctor  or health care professional if they continue or are bothersome.): -dry skin, irritation This list may not describe all possible side effects. Call your doctor for medical advice about side effects. You may report side effects to FDA at 1-800-FDA-1088. Where should I keep my medicine? Keep out of the reach of children. Store at room temperature between 15 and 30 degrees C (59 and 86 degrees F). Throw away any unused medicine after the expiration date. NOTE: This sheet is a summary. It may not cover all possible information. If you have questions about this medicine, talk to your doctor, pharmacist, or health care provider.    2016, Elsevier/Gold Standard. (2009-12-06 15:04:22)

## 2015-04-06 ENCOUNTER — Ambulatory Visit (INDEPENDENT_AMBULATORY_CARE_PROVIDER_SITE_OTHER): Payer: BC Managed Care – PPO | Admitting: Certified Nurse Midwife

## 2015-04-06 VITALS — BP 114/78 | HR 86 | Temp 98.5°F | Wt 267.0 lb

## 2015-04-06 DIAGNOSIS — K640 First degree hemorrhoids: Secondary | ICD-10-CM

## 2015-04-06 DIAGNOSIS — O0992 Supervision of high risk pregnancy, unspecified, second trimester: Secondary | ICD-10-CM

## 2015-04-06 LAB — POCT URINALYSIS DIPSTICK
BILIRUBIN UA: NEGATIVE
Glucose, UA: NEGATIVE
Leukocytes, UA: NEGATIVE
Nitrite, UA: NEGATIVE
PH UA: 6
RBC UA: NEGATIVE
SPEC GRAV UA: 1.01
Urobilinogen, UA: NEGATIVE

## 2015-04-06 MED ORDER — HYDROCORTISONE ACETATE 25 MG RE SUPP: 25.0000 mg | Freq: Two times a day (BID) | RECTAL | Status: DC

## 2015-04-06 NOTE — Progress Notes (Signed)
Subjective:    Gina Banks is a 39 y.o. female being seen today for her obstetrical visit. She is at 10040w3d gestation. Patient reports: no complaints . Fetal movement: normal.  Problem List Items Addressed This Visit    None    Visit Diagnoses    Supervision of high risk pregnancy in second trimester    -  Primary    Relevant Orders    POCT urinalysis dipstick (Completed)    US MFM US 3-DIMENSIONAL  IMAGING    First degree hemorrhoids        Relevant Medications    hydrocortisone (ANUSOL-HC) 25 MG suppository      Patient Active Problem List   Diagnosis Date Noted  . [redacted] weeks gestation of pregnancy   . Advanced maternal age in multigravida   . Hereditary disease in family possibly affecting fetus, affecting management of mother, antepartum condition or complication   . Superficial fungus infection of skin 03/16/2014  . Candida vaginitis 03/16/2014  . Missed abortion 01/24/2014  . Elderly multigravida 01/22/2014  . Unspecified vitamin D deficiency 06/07/2013  . Obesity complicating pregnancy 06/28/2009   Objective:    BP 114/78 mmHg  Pulse 86  Temp(Src) 98.5 F (36.9 C)  Wt 267 lb (121.11 kg) FHT: 150 BPM  Uterine Size: unable to determine d/t maternal body habitus     Assessment:    Pregnancy @ 5740w3d    Maternal obesity  AMA  Hx of HTN  Plan:    OBGCT: discussed and ordered for next visit. Signs and symptoms of preterm labor: discussed.  Labs, problem list reviewed and updated 2 hr GTT planned Follow up in 3 weeks.

## 2015-04-07 ENCOUNTER — Encounter: Payer: BC Managed Care – PPO | Admitting: Certified Nurse Midwife

## 2015-04-27 ENCOUNTER — Encounter: Payer: BC Managed Care – PPO | Admitting: Obstetrics

## 2015-04-27 ENCOUNTER — Other Ambulatory Visit: Payer: BC Managed Care – PPO

## 2015-04-28 ENCOUNTER — Other Ambulatory Visit: Payer: BC Managed Care – PPO

## 2015-04-28 ENCOUNTER — Ambulatory Visit (INDEPENDENT_AMBULATORY_CARE_PROVIDER_SITE_OTHER): Payer: BC Managed Care – PPO | Admitting: Obstetrics

## 2015-04-28 ENCOUNTER — Encounter: Payer: Self-pay | Admitting: Obstetrics

## 2015-04-28 VITALS — BP 123/87 | HR 92 | Temp 99.0°F | Wt 264.0 lb

## 2015-04-28 DIAGNOSIS — Z3490 Encounter for supervision of normal pregnancy, unspecified, unspecified trimester: Secondary | ICD-10-CM

## 2015-04-28 LAB — POCT URINALYSIS DIPSTICK
BILIRUBIN UA: NEGATIVE
GLUCOSE UA: 50
Ketones, UA: NEGATIVE
LEUKOCYTES UA: NEGATIVE
NITRITE UA: NEGATIVE
PH UA: 6
RBC UA: NEGATIVE
Spec Grav, UA: 1.02
UROBILINOGEN UA: 4

## 2015-04-28 LAB — CBC
HCT: 30.4 % — ABNORMAL LOW (ref 36.0–46.0)
Hemoglobin: 10.1 g/dL — ABNORMAL LOW (ref 12.0–15.0)
MCH: 28.2 pg (ref 26.0–34.0)
MCHC: 33.2 g/dL (ref 30.0–36.0)
MCV: 84.9 fL (ref 78.0–100.0)
MPV: 11.1 fL (ref 8.6–12.4)
Platelets: 284 10*3/uL (ref 150–400)
RBC: 3.58 MIL/uL — ABNORMAL LOW (ref 3.87–5.11)
RDW: 13.5 % (ref 11.5–15.5)
WBC: 7.1 10*3/uL (ref 4.0–10.5)

## 2015-04-28 NOTE — Progress Notes (Signed)
Patient noticed some white discharge yesterday- nothing since

## 2015-04-28 NOTE — Progress Notes (Signed)
Subjective:    Gina Banks is a 39 y.o. female being seen today for her obstetrical visit. She is at 2612w4d gestation. Patient reports no complaints. Fetal movement: normal.  Problem List Items Addressed This Visit    None    Visit Diagnoses    Prenatal care, unspecified trimester    -  Primary    Relevant Orders    POCT urinalysis dipstick (Completed)    Glucose Tolerance, 2 Hours w/1 Hour    CBC    HIV antibody    RPR      Patient Active Problem List   Diagnosis Date Noted  . [redacted] weeks gestation of pregnancy   . Advanced maternal age in multigravida   . Hereditary disease in family possibly affecting fetus, affecting management of mother, antepartum condition or complication   . Superficial fungus infection of skin 03/16/2014  . Candida vaginitis 03/16/2014  . Missed abortion 01/24/2014  . Elderly multigravida 01/22/2014  . Unspecified vitamin D deficiency 06/07/2013  . Obesity complicating pregnancy 06/28/2009   Objective:    BP 123/87 mmHg  Pulse 92  Temp(Src) 99 F (37.2 C)  Wt 264 lb (119.75 kg) FHT:  150 BPM  Uterine Size: size greater than dates  Presentation: unsure     Assessment:    Pregnancy @ 9412w4d weeks   Plan:     labs reviewed, problem list updated Consent signed. GBS sent TDAP offered  Rhogam given for RH negative Pediatrician: discussed. Infant feeding: plans to breastfeed. Maternity leave: discussed. Cigarette smoking: never smoked. Orders Placed This Encounter  Procedures  . Glucose Tolerance, 2 Hours w/1 Hour  . CBC  . HIV antibody  . RPR  . POCT urinalysis dipstick   No orders of the defined types were placed in this encounter.   Follow up in 2 Weeks.

## 2015-04-29 LAB — GLUCOSE TOLERANCE, 2 HOURS W/ 1HR
GLUCOSE, 2 HOUR: 57 mg/dL — AB (ref 70–139)
GLUCOSE, FASTING: 75 mg/dL (ref 65–99)
Glucose, 1 hour: 128 mg/dL (ref 70–170)

## 2015-04-29 LAB — HIV ANTIBODY (ROUTINE TESTING W REFLEX): HIV: NONREACTIVE

## 2015-04-29 LAB — RPR

## 2015-05-02 NOTE — L&D Delivery Note (Signed)
Delivery Note At 12:37 AM a viable female was delivered via Vaginal, Spontaneous Delivery (Presentation: Left Occiput Anterior).  APGAR: 7, 9; weight: 2730 grams .   Placenta status: Intact , .  Cord: 3 vessels with the following complications: None.  Cord pH: none  Anesthesia: None  Episiotomy: None Lacerations: None Suture Repair: None Est. Blood Loss (mL): 350  Mom to postpartum.  Baby to Couplet care / Skin to Skin.  Hellen Shanley A 07/03/2015, 1:32 AM

## 2015-05-06 ENCOUNTER — Telehealth: Payer: Self-pay | Admitting: *Deleted

## 2015-05-06 NOTE — Telephone Encounter (Signed)
Pt called to office for glucose results. Return call to pt. Pt made aware of results. Pt has no other concerns.

## 2015-05-10 ENCOUNTER — Ambulatory Visit (HOSPITAL_COMMUNITY)
Admission: RE | Admit: 2015-05-10 | Discharge: 2015-05-10 | Disposition: A | Discharge status: Home / Self Care | Payer: BC Managed Care – PPO | Source: Ambulatory Visit | Attending: Certified Nurse Midwife | Admitting: Certified Nurse Midwife

## 2015-05-10 ENCOUNTER — Ambulatory Visit (INDEPENDENT_AMBULATORY_CARE_PROVIDER_SITE_OTHER): Payer: BC Managed Care – PPO | Admitting: Obstetrics

## 2015-05-10 ENCOUNTER — Encounter: Payer: Self-pay | Admitting: Obstetrics

## 2015-05-10 ENCOUNTER — Encounter (HOSPITAL_COMMUNITY): Payer: Self-pay

## 2015-05-10 ENCOUNTER — Other Ambulatory Visit (HOSPITAL_COMMUNITY): Payer: BC Managed Care – PPO

## 2015-05-10 VITALS — BP 126/83 | HR 101 | Temp 98.3°F | Wt 265.0 lb

## 2015-05-10 VITALS — BP 136/82 | HR 117 | Wt 265.6 lb

## 2015-05-10 DIAGNOSIS — O283 Abnormal ultrasonic finding on antenatal screening of mother: Secondary | ICD-10-CM | POA: Diagnosis not present

## 2015-05-10 DIAGNOSIS — O09529 Supervision of elderly multigravida, unspecified trimester: Secondary | ICD-10-CM

## 2015-05-10 DIAGNOSIS — O10013 Pre-existing essential hypertension complicating pregnancy, third trimester: Secondary | ICD-10-CM | POA: Diagnosis present

## 2015-05-10 DIAGNOSIS — O99843 Bariatric surgery status complicating pregnancy, third trimester: Secondary | ICD-10-CM | POA: Diagnosis not present

## 2015-05-10 DIAGNOSIS — O09513 Supervision of elderly primigravida, third trimester: Secondary | ICD-10-CM

## 2015-05-10 DIAGNOSIS — O99213 Obesity complicating pregnancy, third trimester: Secondary | ICD-10-CM | POA: Diagnosis not present

## 2015-05-10 DIAGNOSIS — Z3A30 30 weeks gestation of pregnancy: Secondary | ICD-10-CM | POA: Diagnosis not present

## 2015-05-10 DIAGNOSIS — Z36 Encounter for antenatal screening of mother: Secondary | ICD-10-CM | POA: Diagnosis not present

## 2015-05-10 DIAGNOSIS — O09523 Supervision of elderly multigravida, third trimester: Secondary | ICD-10-CM | POA: Insufficient documentation

## 2015-05-10 DIAGNOSIS — Z3493 Encounter for supervision of normal pregnancy, unspecified, third trimester: Secondary | ICD-10-CM

## 2015-05-10 LAB — POCT URINALYSIS DIPSTICK
Bilirubin, UA: NEGATIVE
Glucose, UA: NEGATIVE
NITRITE UA: NEGATIVE
PH UA: 5
PROTEIN UA: NEGATIVE
RBC UA: 250
Spec Grav, UA: 1.015
UROBILINOGEN UA: NEGATIVE

## 2015-05-10 NOTE — Progress Notes (Signed)
Subjective:    Gina Banks is a 40 y.o. female being seen today for her obstetrical visit. She is at 7882w2d gestation. Patient reports no complaints. Fetal movement: normal.  Problem List Items Addressed This Visit    None    Visit Diagnoses    Prenatal care, third trimester    -  Primary    Relevant Orders    POCT urinalysis dipstick (Completed)      Patient Active Problem List   Diagnosis Date Noted  . [redacted] weeks gestation of pregnancy   . Advanced maternal age in multigravida   . Hereditary disease in family possibly affecting fetus, affecting management of mother, antepartum condition or complication   . Superficial fungus infection of skin 03/16/2014  . Candida vaginitis 03/16/2014  . Missed abortion 01/24/2014  . Elderly multigravida 01/22/2014  . Unspecified vitamin D deficiency 06/07/2013  . Obesity complicating pregnancy 06/28/2009   Objective:    BP 126/83 mmHg  Pulse 101  Temp(Src) 98.3 F (36.8 C)  Wt 265 lb (120.203 kg) FHT:  150 BPM  Uterine Size: size greater than dates  Presentation: unsure     Assessment:    Pregnancy @ 2282w2d weeks   Plan:     labs reviewed, problem list updated Consent signed. GBS sent TDAP offered  Rhogam given for RH negative Pediatrician: discussed. Infant feeding: plans to breastfeed. Maternity leave: discussed. Cigarette smoking: never smoked. Orders Placed This Encounter  Procedures  . POCT urinalysis dipstick   No orders of the defined types were placed in this encounter.   Follow up in 2 Weeks.

## 2015-05-10 NOTE — Progress Notes (Signed)
Patient has questions about her delivery

## 2015-05-11 ENCOUNTER — Other Ambulatory Visit: Payer: Self-pay | Admitting: Certified Nurse Midwife

## 2015-05-20 ENCOUNTER — Ambulatory Visit (INDEPENDENT_AMBULATORY_CARE_PROVIDER_SITE_OTHER): Payer: BC Managed Care – PPO | Admitting: Obstetrics

## 2015-05-20 ENCOUNTER — Encounter: Payer: Self-pay | Admitting: Obstetrics

## 2015-05-20 VITALS — BP 129/84 | HR 88 | Temp 98.7°F | Wt 268.0 lb

## 2015-05-20 DIAGNOSIS — O99213 Obesity complicating pregnancy, third trimester: Secondary | ICD-10-CM

## 2015-05-20 DIAGNOSIS — O09523 Supervision of elderly multigravida, third trimester: Secondary | ICD-10-CM

## 2015-05-20 NOTE — Progress Notes (Signed)
Subjective:    Gina Banks is a 40 y.o. female being seen today for her obstetrical visit. She is at [redacted]w[redacted]d gestation. Patient reports nausea. Fetal movement: normal.  Problem List Items Addressed This Visit    Advanced maternal age in multigravida - Primary    Other Visit Diagnoses    Obesity affecting pregnancy, antepartum, third trimester          Patient Active Problem List   Diagnosis Date Noted  . [redacted] weeks gestation of pregnancy   . Advanced maternal age in multigravida   . Hereditary disease in family possibly affecting fetus, affecting management of mother, antepartum condition or complication   . Superficial fungus infection of skin 03/16/2014  . Candida vaginitis 03/16/2014  . Missed abortion 01/24/2014  . Elderly multigravida 01/22/2014  . Unspecified vitamin D deficiency 06/07/2013  . Obesity complicating pregnancy 06/28/2009   Objective:    BP 129/84 mmHg  Pulse 88  Temp(Src) 98.7 F (37.1 C)  Wt 268 lb (121.564 kg) FHT:  150 BPM  Uterine Size: size equals dates  Presentation: unsure     Assessment:    Pregnancy @ [redacted]w[redacted]d weeks   Plan:     labs reviewed, problem list updated Consent signed. GBS sent TDAP offered  Rhogam given for RH negative Pediatrician: discussed. Infant feeding: plans to breastfeed. Maternity leave: discussed. Cigarette smoking: never smoked. No orders of the defined types were placed in this encounter.   No orders of the defined types were placed in this encounter.   Follow up in 2 Weeks.

## 2015-05-25 ENCOUNTER — Encounter: Payer: BC Managed Care – PPO | Admitting: Obstetrics

## 2015-06-03 ENCOUNTER — Ambulatory Visit (INDEPENDENT_AMBULATORY_CARE_PROVIDER_SITE_OTHER): Payer: BC Managed Care – PPO | Admitting: Obstetrics

## 2015-06-03 VITALS — BP 127/84 | HR 91 | Temp 98.3°F | Wt 269.0 lb

## 2015-06-03 DIAGNOSIS — O0993 Supervision of high risk pregnancy, unspecified, third trimester: Secondary | ICD-10-CM

## 2015-06-04 ENCOUNTER — Encounter: Payer: Self-pay | Admitting: Obstetrics

## 2015-06-04 ENCOUNTER — Telehealth: Payer: Self-pay

## 2015-06-04 NOTE — Progress Notes (Signed)
Subjective:    Gina Banks is a 40 y.o. female being seen today for her obstetrical visit. She is at [redacted]w[redacted]d gestation. Patient reports no complaints. Fetal movement: normal.  Problem List Items Addressed This Visit    None    Visit Diagnoses    Supervision of high risk pregnancy in third trimester    -  Primary    Relevant Orders    POCT urinalysis dipstick      Patient Active Problem List   Diagnosis Date Noted  . [redacted] weeks gestation of pregnancy   . Advanced maternal age in multigravida   . Hereditary disease in family possibly affecting fetus, affecting management of mother, antepartum condition or complication   . Superficial fungus infection of skin 03/16/2014  . Candida vaginitis 03/16/2014  . Missed abortion 01/24/2014  . Elderly multigravida 01/22/2014  . Unspecified vitamin D deficiency 06/07/2013  . Obesity complicating pregnancy 06/28/2009   Objective:    BP 127/84 mmHg  Pulse 91  Temp(Src) 98.3 F (36.8 C)  Wt 269 lb (122.018 kg) FHT:  150 BPM  Uterine Size: size greater than dates  Presentation: unsure     Assessment:    Pregnancy @ [redacted]w[redacted]d weeks   Plan:     labs reviewed, problem list updated Consent signed. GBS sent TDAP offered  Rhogam given for RH negative Pediatrician: discussed. Infant feeding: plans to breastfeed. Maternity leave: discussed. Cigarette smoking: never smoked. Orders Placed This Encounter  Procedures  . POCT urinalysis dipstick   No orders of the defined types were placed in this encounter.   Follow up in 2 Weeks.

## 2015-06-04 NOTE — Telephone Encounter (Signed)
FMLA PAPERS - CALLED PATIENT TOLD HER THEY WERE COMPLETED , 15.00 CHARGE

## 2015-06-17 ENCOUNTER — Ambulatory Visit (INDEPENDENT_AMBULATORY_CARE_PROVIDER_SITE_OTHER): Payer: BC Managed Care – PPO | Admitting: Obstetrics

## 2015-06-17 VITALS — BP 135/92 | HR 78 | Temp 98.2°F | Wt 268.0 lb

## 2015-06-17 DIAGNOSIS — O0993 Supervision of high risk pregnancy, unspecified, third trimester: Secondary | ICD-10-CM

## 2015-06-18 ENCOUNTER — Encounter: Payer: Self-pay | Admitting: Obstetrics

## 2015-06-18 LAB — POCT URINALYSIS DIPSTICK
BILIRUBIN UA: NEGATIVE
Blood, UA: NEGATIVE
Glucose, UA: NEGATIVE
KETONES UA: NEGATIVE
LEUKOCYTES UA: NEGATIVE
Nitrite, UA: NEGATIVE
PROTEIN UA: NEGATIVE
SPEC GRAV UA: 1.015
Urobilinogen, UA: NEGATIVE
pH, UA: 6

## 2015-06-18 NOTE — Progress Notes (Signed)
Subjective:    Gina Banks is a 40 y.o. female being seen today for her obstetrical visit. She is at [redacted]w[redacted]d gestation. Patient reports no complaints. Fetal movement: normal.  Problem List Items Addressed This Visit    None    Visit Diagnoses    Supervision of high risk pregnancy in third trimester    -  Primary    Relevant Orders    POCT urinalysis dipstick      Patient Active Problem List   Diagnosis Date Noted  . [redacted] weeks gestation of pregnancy   . Advanced maternal age in multigravida   . Hereditary disease in family possibly affecting fetus, affecting management of mother, antepartum condition or complication   . Superficial fungus infection of skin 03/16/2014  . Candida vaginitis 03/16/2014  . Missed abortion 01/24/2014  . Elderly multigravida 01/22/2014  . Unspecified vitamin D deficiency 06/07/2013  . Obesity complicating pregnancy 06/28/2009   Objective:    BP 135/92 mmHg  Pulse 78  Temp(Src) 98.2 F (36.8 C)  Wt 268 lb (121.564 kg) FHT:  150 BPM  Uterine Size: size equals dates  Presentation: unsure     Assessment:    Pregnancy @ [redacted]w[redacted]d weeks   Plan:     labs reviewed, problem list updated Consent signed. GBS sent TDAP offered  Rhogam given for RH negative Pediatrician: discussed. Infant feeding: plans to breastfeed. Maternity leave: discussed. Cigarette smoking: never smoked. Orders Placed This Encounter  Procedures  . POCT urinalysis dipstick   No orders of the defined types were placed in this encounter.   Follow up in 1 Week.

## 2015-06-24 ENCOUNTER — Ambulatory Visit (INDEPENDENT_AMBULATORY_CARE_PROVIDER_SITE_OTHER): Payer: BC Managed Care – PPO | Admitting: Obstetrics

## 2015-06-24 VITALS — BP 134/87 | HR 93 | Temp 98.3°F | Wt 272.0 lb

## 2015-06-24 DIAGNOSIS — O09523 Supervision of elderly multigravida, third trimester: Secondary | ICD-10-CM

## 2015-06-24 DIAGNOSIS — O0993 Supervision of high risk pregnancy, unspecified, third trimester: Secondary | ICD-10-CM

## 2015-06-24 LAB — POCT URINALYSIS DIPSTICK
BILIRUBIN UA: NEGATIVE
GLUCOSE UA: NEGATIVE
Ketones, UA: NEGATIVE
Leukocytes, UA: NEGATIVE
NITRITE UA: NEGATIVE
PH UA: 5
Protein, UA: NEGATIVE
RBC UA: NEGATIVE
SPEC GRAV UA: 1.02
UROBILINOGEN UA: NEGATIVE

## 2015-06-25 ENCOUNTER — Ambulatory Visit (HOSPITAL_COMMUNITY)
Admission: RE | Admit: 2015-06-25 | Discharge: 2015-06-25 | Disposition: A | Discharge status: Home / Self Care | Payer: BC Managed Care – PPO | Source: Ambulatory Visit | Attending: Certified Nurse Midwife | Admitting: Certified Nurse Midwife

## 2015-06-25 ENCOUNTER — Other Ambulatory Visit (HOSPITAL_COMMUNITY): Payer: Self-pay | Admitting: Maternal and Fetal Medicine

## 2015-06-25 ENCOUNTER — Encounter (HOSPITAL_COMMUNITY): Payer: Self-pay

## 2015-06-25 DIAGNOSIS — O99213 Obesity complicating pregnancy, third trimester: Secondary | ICD-10-CM | POA: Diagnosis not present

## 2015-06-25 DIAGNOSIS — O09529 Supervision of elderly multigravida, unspecified trimester: Secondary | ICD-10-CM

## 2015-06-25 DIAGNOSIS — Z3A36 36 weeks gestation of pregnancy: Secondary | ICD-10-CM

## 2015-06-25 DIAGNOSIS — O289 Unspecified abnormal findings on antenatal screening of mother: Secondary | ICD-10-CM

## 2015-06-25 DIAGNOSIS — O10019 Pre-existing essential hypertension complicating pregnancy, unspecified trimester: Secondary | ICD-10-CM

## 2015-06-25 DIAGNOSIS — O283 Abnormal ultrasonic finding on antenatal screening of mother: Secondary | ICD-10-CM | POA: Insufficient documentation

## 2015-06-25 DIAGNOSIS — O99842 Bariatric surgery status complicating pregnancy, second trimester: Secondary | ICD-10-CM

## 2015-06-25 DIAGNOSIS — O99843 Bariatric surgery status complicating pregnancy, third trimester: Secondary | ICD-10-CM | POA: Insufficient documentation

## 2015-06-25 DIAGNOSIS — O09523 Supervision of elderly multigravida, third trimester: Secondary | ICD-10-CM | POA: Diagnosis not present

## 2015-06-25 DIAGNOSIS — O10013 Pre-existing essential hypertension complicating pregnancy, third trimester: Secondary | ICD-10-CM | POA: Insufficient documentation

## 2015-06-25 NOTE — Progress Notes (Signed)
Subjective:    Gina Banks is a 40 y.o. female being seen today for her obstetrical visit. She is at [redacted]w[redacted]d gestation. Patient reports no complaints. Fetal movement: normal.  Problem List Items Addressed This Visit    None    Visit Diagnoses    Supervision of high risk pregnancy in third trimester    -  Primary    Relevant Orders    POCT urinalysis dipstick (Completed)    Strep B DNA probe      Patient Active Problem List   Diagnosis Date Noted  . [redacted] weeks gestation of pregnancy   . Advanced maternal age in multigravida   . Hereditary disease in family possibly affecting fetus, affecting management of mother, antepartum condition or complication   . Superficial fungus infection of skin 03/16/2014  . Candida vaginitis 03/16/2014  . Missed abortion 01/24/2014  . Elderly multigravida 01/22/2014  . Unspecified vitamin D deficiency 06/07/2013  . Obesity complicating pregnancy 06/28/2009   Objective:    BP 134/87 mmHg  Pulse 93  Temp(Src) 98.3 F (36.8 C)  Wt 272 lb (123.378 kg) FHT:  150 BPM  Uterine Size: size equals dates  Presentation: cephalic     Assessment:    Pregnancy @ [redacted]w[redacted]d weeks    Elevated BP  Plan:    Start maternity leave.  Increase rest.  Monitor BP closely.  May need Labetalol.  Recheck BP in 24 hours   labs reviewed, problem list updated Consent signed. GBS sent TDAP offered  Rhogam given for RH negative Pediatrician: discussed. Infant feeding: plans to breastfeed. Maternity leave: discussed. Cigarette smoking: never smoked.  Orders Placed This Encounter  Procedures  . Strep B DNA probe  . POCT urinalysis dipstick   No orders of the defined types were placed in this encounter.   Follow up in 1 Week.

## 2015-06-26 LAB — STREP B DNA PROBE: STREP GROUP B AG: NOT DETECTED

## 2015-06-28 ENCOUNTER — Ambulatory Visit (INDEPENDENT_AMBULATORY_CARE_PROVIDER_SITE_OTHER): Payer: BC Managed Care – PPO | Admitting: Obstetrics

## 2015-06-28 ENCOUNTER — Encounter: Payer: Self-pay | Admitting: Obstetrics

## 2015-06-28 VITALS — BP 144/91 | HR 90 | Temp 98.3°F | Wt 273.0 lb

## 2015-06-28 DIAGNOSIS — J302 Other seasonal allergic rhinitis: Secondary | ICD-10-CM

## 2015-06-28 DIAGNOSIS — Z331 Pregnant state, incidental: Secondary | ICD-10-CM

## 2015-06-28 DIAGNOSIS — Z1389 Encounter for screening for other disorder: Secondary | ICD-10-CM

## 2015-06-28 DIAGNOSIS — O133 Gestational [pregnancy-induced] hypertension without significant proteinuria, third trimester: Secondary | ICD-10-CM

## 2015-06-28 LAB — POCT URINALYSIS DIPSTICK
Bilirubin, UA: NEGATIVE
Blood, UA: NEGATIVE
Glucose, UA: NEGATIVE
KETONES UA: NEGATIVE
LEUKOCYTES UA: NEGATIVE
Nitrite, UA: NEGATIVE
Protein, UA: NEGATIVE
SPEC GRAV UA: 1.01
Urobilinogen, UA: NEGATIVE
pH, UA: 5

## 2015-06-28 MED ORDER — LORATADINE 10 MG PO TABS: 10.0000 mg | ORAL_TABLET | Freq: Every day | ORAL | Status: DC

## 2015-06-28 MED ORDER — LABETALOL HCL 200 MG PO TABS: 200.0000 mg | ORAL_TABLET | Freq: Three times a day (TID) | ORAL | Status: DC

## 2015-06-28 NOTE — Progress Notes (Signed)
Subjective:    Gina Banks is a 40 y.o. female being seen today for her obstetrical visit. She is at [redacted]w[redacted]d gestation. Patient reports no complaints. Fetal movement: normal.  Problem List Items Addressed This Visit    None    Visit Diagnoses    Gestational hypertension, third trimester    -  Primary    Relevant Medications    labetalol (NORMODYNE) 200 MG tablet    Other seasonal allergic rhinitis        Relevant Medications    loratadine (CLARITIN) 10 MG tablet      Patient Active Problem List   Diagnosis Date Noted  . [redacted] weeks gestation of pregnancy   . Advanced maternal age in multigravida   . Hereditary disease in family possibly affecting fetus, affecting management of mother, antepartum condition or complication   . Superficial fungus infection of skin 03/16/2014  . Candida vaginitis 03/16/2014  . Missed abortion 01/24/2014  . Elderly multigravida 01/22/2014  . Unspecified vitamin D deficiency 06/07/2013  . Obesity complicating pregnancy 06/28/2009    Objective:    BP 144/91 mmHg  Pulse 90  Temp(Src) 98.3 F (36.8 C)  Wt 273 lb (123.832 kg) FHT: 150 BPM  Uterine Size: size greater than dates  Presentations: unsure    Assessment:    Pregnancy @ [redacted]w[redacted]d weeks   Plan:   Plans for delivery: Vaginal anticipated; labs reviewed; problem list updated Counseling: Consent signed. Infant feeding: plans to breastfeed. Cigarette smoking: never smoked. L&D discussion: symptoms of labor, discussed when to call, discussed what number to call, anesthetic/analgesic options reviewed and delivering clinician:  plans no preference. Postpartum supports and preparation: circumcision discussed and contraception plans discussed.  Follow up in 1 Week.

## 2015-06-28 NOTE — Addendum Note (Signed)
Addended by: Henriette Combs on: 06/28/2015 04:56 PM   Modules accepted: Orders

## 2015-06-29 ENCOUNTER — Other Ambulatory Visit: Payer: BC Managed Care – PPO

## 2015-06-29 ENCOUNTER — Other Ambulatory Visit (HOSPITAL_COMMUNITY): Payer: Self-pay | Admitting: *Deleted

## 2015-06-29 DIAGNOSIS — O169 Unspecified maternal hypertension, unspecified trimester: Secondary | ICD-10-CM

## 2015-06-30 ENCOUNTER — Telehealth (HOSPITAL_COMMUNITY): Payer: Self-pay | Admitting: *Deleted

## 2015-06-30 ENCOUNTER — Encounter (HOSPITAL_COMMUNITY): Payer: Self-pay | Admitting: *Deleted

## 2015-06-30 NOTE — Telephone Encounter (Signed)
Preadmission screen  

## 2015-07-02 ENCOUNTER — Inpatient Hospital Stay (HOSPITAL_COMMUNITY)
Admission: AD | Admit: 2015-07-02 | Discharge: 2015-07-05 | DRG: 775 | Disposition: A | Discharge status: Home / Self Care | Payer: BC Managed Care – PPO | Source: Ambulatory Visit | Attending: Obstetrics | Admitting: Obstetrics

## 2015-07-02 ENCOUNTER — Ambulatory Visit (HOSPITAL_COMMUNITY)
Admission: RE | Admit: 2015-07-02 | Discharge: 2015-07-02 | Disposition: A | Discharge status: Home / Self Care | Payer: BC Managed Care – PPO | Source: Ambulatory Visit | Attending: Obstetrics | Admitting: Obstetrics

## 2015-07-02 ENCOUNTER — Other Ambulatory Visit (HOSPITAL_COMMUNITY): Payer: Self-pay | Admitting: Maternal and Fetal Medicine

## 2015-07-02 ENCOUNTER — Encounter (HOSPITAL_COMMUNITY): Payer: Self-pay | Admitting: *Deleted

## 2015-07-02 ENCOUNTER — Encounter (HOSPITAL_COMMUNITY): Payer: Self-pay

## 2015-07-02 DIAGNOSIS — Z8249 Family history of ischemic heart disease and other diseases of the circulatory system: Secondary | ICD-10-CM | POA: Diagnosis not present

## 2015-07-02 DIAGNOSIS — O99843 Bariatric surgery status complicating pregnancy, third trimester: Secondary | ICD-10-CM

## 2015-07-02 DIAGNOSIS — O99213 Obesity complicating pregnancy, third trimester: Secondary | ICD-10-CM

## 2015-07-02 DIAGNOSIS — O134 Gestational [pregnancy-induced] hypertension without significant proteinuria, complicating childbirth: Secondary | ICD-10-CM | POA: Diagnosis present

## 2015-07-02 DIAGNOSIS — O289 Unspecified abnormal findings on antenatal screening of mother: Secondary | ICD-10-CM

## 2015-07-02 DIAGNOSIS — Z3A37 37 weeks gestation of pregnancy: Secondary | ICD-10-CM

## 2015-07-02 DIAGNOSIS — O09523 Supervision of elderly multigravida, third trimester: Secondary | ICD-10-CM

## 2015-07-02 DIAGNOSIS — Z833 Family history of diabetes mellitus: Secondary | ICD-10-CM

## 2015-07-02 DIAGNOSIS — IMO0001 Reserved for inherently not codable concepts without codable children: Secondary | ICD-10-CM

## 2015-07-02 DIAGNOSIS — O99844 Bariatric surgery status complicating childbirth: Secondary | ICD-10-CM | POA: Diagnosis present

## 2015-07-02 DIAGNOSIS — Z6841 Body Mass Index (BMI) 40.0 and over, adult: Secondary | ICD-10-CM

## 2015-07-02 DIAGNOSIS — O169 Unspecified maternal hypertension, unspecified trimester: Secondary | ICD-10-CM

## 2015-07-02 DIAGNOSIS — O4292 Full-term premature rupture of membranes, unspecified as to length of time between rupture and onset of labor: Principal | ICD-10-CM | POA: Diagnosis present

## 2015-07-02 LAB — CBC
HCT: 30.8 % — ABNORMAL LOW (ref 36.0–46.0)
HEMOGLOBIN: 10.2 g/dL — AB (ref 12.0–15.0)
MCH: 28.2 pg (ref 26.0–34.0)
MCHC: 33.1 g/dL (ref 30.0–36.0)
MCV: 85.1 fL (ref 78.0–100.0)
Platelets: 259 10*3/uL (ref 150–400)
RBC: 3.62 MIL/uL — ABNORMAL LOW (ref 3.87–5.11)
RDW: 14.8 % (ref 11.5–15.5)
WBC: 5.8 10*3/uL (ref 4.0–10.5)

## 2015-07-02 LAB — URINALYSIS, ROUTINE W REFLEX MICROSCOPIC
BILIRUBIN URINE: NEGATIVE
GLUCOSE, UA: NEGATIVE mg/dL
KETONES UR: NEGATIVE mg/dL
Leukocytes, UA: NEGATIVE
Nitrite: NEGATIVE
PROTEIN: NEGATIVE mg/dL
Specific Gravity, Urine: 1.025 (ref 1.005–1.030)
pH: 6 (ref 5.0–8.0)

## 2015-07-02 LAB — URINE MICROSCOPIC-ADD ON

## 2015-07-02 LAB — POCT FERN TEST

## 2015-07-02 LAB — TYPE AND SCREEN
ABO/RH(D): O POS
ANTIBODY SCREEN: NEGATIVE

## 2015-07-02 LAB — ABO/RH: ABO/RH(D): O POS

## 2015-07-02 MED ORDER — OXYTOCIN BOLUS FROM INFUSION: 500.0000 mL | INTRAVENOUS | Status: DC

## 2015-07-02 MED ORDER — FLEET ENEMA 7-19 GM/118ML RE ENEM: 1.0000 | ENEMA | RECTAL | Status: DC | PRN

## 2015-07-02 MED ORDER — OXYTOCIN 10 UNIT/ML IJ SOLN
2.5000 [IU]/h | INTRAMUSCULAR | Status: DC
  Filled 2015-07-02: qty 10

## 2015-07-02 MED ORDER — LACTATED RINGERS IV SOLN: INTRAVENOUS | Status: DC

## 2015-07-02 MED ORDER — ONDANSETRON HCL 4 MG/2ML IJ SOLN: 4.0000 mg | Freq: Four times a day (QID) | INTRAMUSCULAR | Status: DC | PRN

## 2015-07-02 MED ORDER — OXYTOCIN 10 UNIT/ML IJ SOLN: 2.5000 [IU]/h | INTRAVENOUS | Status: DC

## 2015-07-02 MED ORDER — LIDOCAINE HCL (PF) 1 % IJ SOLN
30.0000 mL | INTRAMUSCULAR | Status: DC | PRN
  Filled 2015-07-02: qty 30

## 2015-07-02 MED ORDER — CITRIC ACID-SODIUM CITRATE 334-500 MG/5ML PO SOLN: 30.0000 mL | ORAL | Status: DC | PRN

## 2015-07-02 MED ORDER — NALBUPHINE HCL 10 MG/ML IJ SOLN: 10.0000 mg | Freq: Four times a day (QID) | INTRAMUSCULAR | Status: DC | PRN

## 2015-07-02 MED ORDER — LABETALOL HCL 200 MG PO TABS: 200.0000 mg | ORAL_TABLET | Freq: Three times a day (TID) | ORAL | Status: DC

## 2015-07-02 MED ORDER — MISOPROSTOL 200 MCG PO TABS
50.0000 ug | ORAL_TABLET | Freq: Four times a day (QID) | ORAL | Status: DC
  Administered 2015-07-02: 50 ug via ORAL
  Filled 2015-07-02 (×3): qty 0.5

## 2015-07-02 MED ORDER — LACTATED RINGERS IV SOLN: 500.0000 mL | INTRAVENOUS | Status: DC | PRN

## 2015-07-02 MED ORDER — OXYCODONE-ACETAMINOPHEN 5-325 MG PO TABS: 2.0000 | ORAL_TABLET | ORAL | Status: DC | PRN

## 2015-07-02 MED ORDER — PROMETHAZINE HCL 25 MG/ML IJ SOLN: 25.0000 mg | Freq: Four times a day (QID) | INTRAMUSCULAR | Status: DC | PRN

## 2015-07-02 MED ORDER — TERBUTALINE SULFATE 1 MG/ML IJ SOLN: 0.2500 mg | Freq: Once | INTRAMUSCULAR | Status: DC | PRN

## 2015-07-02 MED ORDER — LABETALOL HCL 100 MG PO TABS: 200.0000 mg | ORAL_TABLET | Freq: Once | ORAL | Status: DC

## 2015-07-02 MED ORDER — LABETALOL HCL 200 MG PO TABS
200.0000 mg | ORAL_TABLET | Freq: Three times a day (TID) | ORAL | Status: DC
  Administered 2015-07-02: 200 mg via ORAL
  Filled 2015-07-02 (×4): qty 1

## 2015-07-02 MED ORDER — ACETAMINOPHEN 325 MG PO TABS: 650.0000 mg | ORAL_TABLET | ORAL | Status: DC | PRN

## 2015-07-02 MED ORDER — NALBUPHINE HCL 10 MG/ML IJ SOLN: 10.0000 mg | INTRAMUSCULAR | Status: DC | PRN

## 2015-07-02 MED ORDER — MISOPROSTOL 25 MCG QUARTER TABLET: 25.0000 ug | ORAL_TABLET | ORAL | Status: DC | PRN

## 2015-07-02 MED ORDER — OXYCODONE-ACETAMINOPHEN 5-325 MG PO TABS: 1.0000 | ORAL_TABLET | ORAL | Status: DC | PRN

## 2015-07-02 MED ORDER — OXYTOCIN BOLUS FROM INFUSION
500.0000 mL | INTRAVENOUS | Status: DC
  Administered 2015-07-03: 500 mL via INTRAVENOUS

## 2015-07-02 NOTE — ED Notes (Signed)
Pt to MAU for evaluation of possible SROM.

## 2015-07-02 NOTE — H&P (Signed)
Gina Banks is a 40 y.o. female presenting for leaking clear fluid.  No UC's. Maternal Medical History:  Reason for admission: Rupture of membranes.   Fetal activity: Perceived fetal activity is normal.   Last perceived fetal movement was within the past hour.    Prenatal complications: no prenatal complications Prenatal Complications - Diabetes: none.    OB History    Gravida Para Term Preterm AB TAB SAB Ectopic Multiple Living   5 1 1  3 1 2   1      Past Medical History  Diagnosis Date  . Gastric bypass status for obesity   . Cyst of breast   . Hx of cholecystectomy   . Hypertension     on meds now  . Herpes    Past Surgical History  Procedure Laterality Date  . Cholecystectomy    . Gastric bypass    . Breast cyst excision    . Fallopian tube cyst    . Cyst removal neck Right   . Dilation and curettage of uterus N/A 01/28/2014    Procedure: DILATATION AND CURETTAGE SUCTION WITH CHROMOSOME STUDIES;  Surgeon: Antionette CharLisa Jackson-Moore, MD;  Location: [facility];  Service: Gynecology;  Laterality: N/A;   Family History: family history includes Cancer in her mother; Diabetes in her father; Heart attack in her paternal grandmother; Hypertension in her father. Social History:  reports that she has never smoked. She has never used smokeless tobacco. She reports that she does not drink alcohol or use illicit drugs.   Prenatal Transfer Tool  Maternal Diabetes: No Genetic Screening: AMA Maternal Ultrasounds/Referrals: Normal Fetal Ultrasounds or other Referrals:  Referred to Materal Fetal Medicine  Maternal Substance Abuse:  No Significant Maternal Medications:  None Significant Maternal Lab Results:  None Other Comments:  None  Review of Systems  All other systems reviewed and are negative.   Dilation: Fingertip Effacement (%): Thick Station: -3 Exam by:: a. thacker rn Blood pressure 150/85, pulse 77, temperature 98.3 F (36.8 C), temperature source Oral, resp. rate  18, height 5\' 7"  (1.702 m), weight 273 lb (123.832 kg), unknown if currently breastfeeding. Maternal Exam:  Abdomen: Patient reports no abdominal tenderness. Fetal presentation: vertex  Introitus: Normal vulva. Normal vagina.  Ferning test: positive.   Pelvis: adequate for delivery.   Cervix: Cervix evaluated by digital exam.     Physical Exam  Nursing note and vitals reviewed. Constitutional: She is oriented to person, place, and time. She appears well-developed and well-nourished.  HENT:  Head: Normocephalic and atraumatic.  Eyes: Conjunctivae are normal. Pupils are equal, round, and reactive to light.  Neck: Normal range of motion. Neck supple.  Cardiovascular: Normal rate and regular rhythm.   Respiratory: Effort normal and breath sounds normal.  GI: Soft.  Genitourinary: Vagina normal and uterus normal.  Musculoskeletal: Normal range of motion.  Neurological: She is alert and oriented to person, place, and time.  Skin: Skin is warm and dry.  Psychiatric: She has a normal mood and affect. Her behavior is normal. Judgment and thought content normal.    Prenatal labs: ABO, Rh: --/--/O POS, O POS (03/03 1723) Antibody: NEG (03/03 1723) Rubella: 9.85 (09/07 1108) RPR: NON REAC (12/28 1003)  HBsAg: NEGATIVE (09/07 1108)  HIV: NONREACTIVE (12/28 1003)  GBS: NOT DETECTED (02/23 1723)   Assessment/Plan: 37.5 weeks.  PROM.  Admit.   Antia Rahal A 07/02/2015, 11:36 PM

## 2015-07-02 NOTE — MAU Note (Addendum)
C/o ?SROM at 0330 this AM; denies any pain; scheduled for induction on 07/13/15;

## 2015-07-03 ENCOUNTER — Encounter (HOSPITAL_COMMUNITY): Payer: Self-pay

## 2015-07-03 LAB — CBC
HCT: 29.1 % — ABNORMAL LOW (ref 36.0–46.0)
Hemoglobin: 9.8 g/dL — ABNORMAL LOW (ref 12.0–15.0)
MCH: 28.5 pg (ref 26.0–34.0)
MCHC: 33.7 g/dL (ref 30.0–36.0)
MCV: 84.6 fL (ref 78.0–100.0)
PLATELETS: 233 10*3/uL (ref 150–400)
RBC: 3.44 MIL/uL — ABNORMAL LOW (ref 3.87–5.11)
RDW: 14.8 % (ref 11.5–15.5)
WBC: 10.5 10*3/uL (ref 4.0–10.5)

## 2015-07-03 LAB — RPR: RPR Ser Ql: NONREACTIVE

## 2015-07-03 MED ORDER — WITCH HAZEL-GLYCERIN EX PADS: 1.0000 "application " | MEDICATED_PAD | CUTANEOUS | Status: DC | PRN

## 2015-07-03 MED ORDER — DIPHENHYDRAMINE HCL 25 MG PO CAPS: 25.0000 mg | ORAL_CAPSULE | Freq: Four times a day (QID) | ORAL | Status: DC | PRN

## 2015-07-03 MED ORDER — MAGNESIUM SULFATE BOLUS VIA INFUSION
4.0000 g | Freq: Once | INTRAVENOUS | Status: AC
  Administered 2015-07-03: 4 g via INTRAVENOUS
  Filled 2015-07-03: qty 500

## 2015-07-03 MED ORDER — HYDRALAZINE HCL 20 MG/ML IJ SOLN
10.0000 mg | Freq: Once | INTRAMUSCULAR | Status: DC | PRN
Start: 2015-07-03 — End: 2015-07-03

## 2015-07-03 MED ORDER — LABETALOL HCL 200 MG PO TABS
200.0000 mg | ORAL_TABLET | Freq: Three times a day (TID) | ORAL | Status: DC
  Administered 2015-07-03 – 2015-07-05 (×7): 200 mg via ORAL
  Filled 2015-07-03 (×7): qty 1

## 2015-07-03 MED ORDER — MAGNESIUM SULFATE 50 % IJ SOLN
2.0000 g/h | INTRAVENOUS | Status: DC
  Administered 2015-07-03: 2 g/h via INTRAVENOUS
  Filled 2015-07-03: qty 80

## 2015-07-03 MED ORDER — ZOLPIDEM TARTRATE 5 MG PO TABS: 5.0000 mg | ORAL_TABLET | Freq: Every evening | ORAL | Status: DC | PRN

## 2015-07-03 MED ORDER — OXYCODONE-ACETAMINOPHEN 5-325 MG PO TABS
1.0000 | ORAL_TABLET | ORAL | Status: DC | PRN
  Administered 2015-07-03: 1 via ORAL
  Filled 2015-07-03: qty 1

## 2015-07-03 MED ORDER — PRENATAL MULTIVITAMIN CH
1.0000 | ORAL_TABLET | Freq: Every day | ORAL | Status: DC
Start: 2015-07-03 — End: 2015-07-05
  Administered 2015-07-03 – 2015-07-05 (×3): 1 via ORAL
  Filled 2015-07-03 (×3): qty 1

## 2015-07-03 MED ORDER — ONDANSETRON HCL 4 MG PO TABS: 4.0000 mg | ORAL_TABLET | ORAL | Status: DC | PRN

## 2015-07-03 MED ORDER — OXYCODONE-ACETAMINOPHEN 5-325 MG PO TABS
2.0000 | ORAL_TABLET | ORAL | Status: DC | PRN
  Administered 2015-07-03: 2 via ORAL
  Filled 2015-07-03: qty 2

## 2015-07-03 MED ORDER — LANOLIN HYDROUS EX OINT: TOPICAL_OINTMENT | CUTANEOUS | Status: DC | PRN

## 2015-07-03 MED ORDER — SIMETHICONE 80 MG PO CHEW: 80.0000 mg | CHEWABLE_TABLET | ORAL | Status: DC | PRN

## 2015-07-03 MED ORDER — ACETAMINOPHEN 325 MG PO TABS
650.0000 mg | ORAL_TABLET | ORAL | Status: DC | PRN
  Administered 2015-07-03: 650 mg via ORAL
  Filled 2015-07-03: qty 2

## 2015-07-03 MED ORDER — LACTATED RINGERS IV SOLN
INTRAVENOUS | Status: DC
  Administered 2015-07-03 – 2015-07-04 (×3): via INTRAVENOUS

## 2015-07-03 MED ORDER — IBUPROFEN 600 MG PO TABS
600.0000 mg | ORAL_TABLET | Freq: Four times a day (QID) | ORAL | Status: DC
  Administered 2015-07-03 – 2015-07-05 (×11): 600 mg via ORAL
  Filled 2015-07-03 (×11): qty 1

## 2015-07-03 MED ORDER — ONDANSETRON HCL 4 MG/2ML IJ SOLN: 4.0000 mg | INTRAMUSCULAR | Status: DC | PRN

## 2015-07-03 MED ORDER — BENZOCAINE-MENTHOL 20-0.5 % EX AERO: 1.0000 "application " | INHALATION_SPRAY | CUTANEOUS | Status: DC | PRN

## 2015-07-03 MED ORDER — LACTATED RINGERS IV SOLN
2.0000 g/h | INTRAVENOUS | Status: DC
  Administered 2015-07-03: 2 g/h via INTRAVENOUS
  Filled 2015-07-03: qty 80

## 2015-07-03 MED ORDER — SENNOSIDES-DOCUSATE SODIUM 8.6-50 MG PO TABS
2.0000 | ORAL_TABLET | ORAL | Status: DC
  Administered 2015-07-04 – 2015-07-05 (×2): 2 via ORAL
  Filled 2015-07-03 (×2): qty 2

## 2015-07-03 MED ORDER — LABETALOL HCL 5 MG/ML IV SOLN
20.0000 mg | INTRAVENOUS | Status: DC | PRN
  Administered 2015-07-03: 40 mg via INTRAVENOUS
  Administered 2015-07-03: 20 mg via INTRAVENOUS
  Filled 2015-07-03: qty 8
  Filled 2015-07-03: qty 4

## 2015-07-03 MED ORDER — DIBUCAINE 1 % RE OINT: 1.0000 "application " | TOPICAL_OINTMENT | RECTAL | Status: DC | PRN

## 2015-07-03 MED ORDER — TETANUS-DIPHTH-ACELL PERTUSSIS 5-2.5-18.5 LF-MCG/0.5 IM SUSP
0.5000 mL | Freq: Once | INTRAMUSCULAR | Status: AC
  Administered 2015-07-05: 0.5 mL via INTRAMUSCULAR
  Filled 2015-07-03: qty 0.5

## 2015-07-03 MED ORDER — OXYTOCIN 10 UNIT/ML IJ SOLN: 2.5000 [IU]/h | INTRAVENOUS | Status: DC | PRN

## 2015-07-03 NOTE — Lactation Note (Signed)
This note was copied from a baby's chart. Lactation Consultation Note  Patient Name: Girl Maia BreslowKimberly Ozimek ZOXWR'UToday's Date: 07/03/2015 Reason for consult: Initial assessment Assisted Mom with positioning and latching baby to right breast. Baby tongue thrusting so took few attempts and breast compression to get baby to sustain latch, but once she did baby demonstrated few good suckling bursts off/on. Basic teaching reviewed with Mom. Baby had bottle of formula earlier because she was not sustaining latch or staying awake at the breast per RN. Baby took 15 ml. LC encouraged Mom to keep baby at breast with feedings till her milk comes in well and baby is latching consistently. Reviewed normal feeding patterns for babies in their 1st day of life.  If she continues to supplement advised to give breast before giving any bottles to encourage milk production, protect milk supply. Advised to BF on demand, 8-12 times in 24 hours and with feeding ques. Lactation brochure left for review, advised of OP services and support group. Encouraged to call for assist as needed.   Maternal Data Has patient been taught Hand Expression?: Yes Does the patient have breastfeeding experience prior to this delivery?: Yes  Feeding Feeding Type: Breast Fed Length of feed: 5 min  LATCH Score/Interventions Latch: Repeated attempts needed to sustain latch, nipple held in mouth throughout feeding, stimulation needed to elicit sucking reflex. Intervention(s): Adjust position;Assist with latch;Breast massage;Breast compression  Audible Swallowing: A few with stimulation  Type of Nipple: Everted at rest and after stimulation  Comfort (Breast/Nipple): Soft / non-tender     Hold (Positioning): Assistance needed to correctly position infant at breast and maintain latch. Intervention(s): Breastfeeding basics reviewed;Support Pillows;Position options;Skin to skin  LATCH Score: 7  Lactation Tools Discussed/Used WIC Program:  No   Consult Status Consult Status: Follow-up Date: 07/04/15 Follow-up type: In-patient    Alfred LevinsGranger, Azucena Dart Ann 07/03/2015, 3:21 PM

## 2015-07-03 NOTE — Progress Notes (Signed)
Post Partum Day 0 Subjective: no complaints  Objective: Blood pressure 143/83, pulse 70, temperature 98 F (36.7 C), temperature source Oral, resp. rate 18, height 5\' 7"  (1.702 m), weight 273 lb (123.832 kg), SpO2 100 %, unknown if currently breastfeeding.  Physical Exam:  General: alert and no distress Lochia: appropriate Uterine Fundus: firm Incision: none DVT Evaluation: No evidence of DVT seen on physical exam.   Recent Labs  07/02/15 1723 07/03/15 0556  HGB 10.2* 9.8*  HCT 30.8* 29.1*    Assessment/Plan: Hypertension.  Stable on magnesium sulfate / Labetalol.  D/C magnesium after 24 hours. Plan for discharge tomorrow   LOS: 1 day   HARPER,CHARLES A 07/03/2015, 8:51 AM

## 2015-07-03 NOTE — Progress Notes (Signed)
Gina Banks is a 40 y.o. G5P1031 at 6064w0d by LMP admitted for rupture of membranes  Subjective:   Objective: BP 150/85 mmHg  Pulse 77  Temp(Src) 98.3 F (36.8 C) (Oral)  Resp 18  Ht 5\' 7"  (1.702 m)  Wt 273 lb (123.832 kg)  BMI 42.75 kg/m2      FHT:  FHR: 140 bpm, variability: moderate,  accelerations:  Present,  decelerations:  Absent UC:   regular, every 2 minutes SVE:   Dilation: 10 Effacement (%): Thick Station: +2 Exam by:: L Banks RNC  Labs: Lab Results  Component Value Date   WBC 5.8 07/02/2015   HGB 10.2* 07/02/2015   HCT 30.8* 07/02/2015   MCV 85.1 07/02/2015   PLT 259 07/02/2015    Assessment / Plan: Active labor.  Expectant.  Labor: Progressing normally Preeclampsia:  labs stable Fetal Wellbeing:  Category I Pain Control:  Nubain I/D:  n/a Anticipated MOD:  NSVD  Remijio Holleran A 07/03/2015, 1:02 AM

## 2015-07-04 NOTE — Progress Notes (Signed)
Post Partum Day 1 Subjective: no complaints  Objective: Blood pressure 147/98, pulse 82, temperature 97.9 F (36.6 C), temperature source Oral, resp. rate 15, height 5\' 7"  (1.702 m), weight 273 lb (123.832 kg), SpO2 96 %, unknown if currently breastfeeding.  Physical Exam:  General: alert and no distress Lochia: appropriate Uterine Fundus: firm Incision: none DVT Evaluation: No evidence of DVT seen on physical exam.   Recent Labs  07/02/15 1723 07/03/15 0556  HGB 10.2* 9.8*  HCT 30.8* 29.1*    Assessment/Plan: Postpartum PIH.  Stable.  Continue Magnesium sulfate / Labetalol.   LOS: 2 days   HARPER,CHARLES A 07/04/2015, 5:57 AM

## 2015-07-04 NOTE — Lactation Note (Signed)
This note was copied from a baby's chart. Lactation Consultation Note  Patient Name: Gina Banks'UToday's Date: 07/04/2015 Reason for consult: Follow-up assessment   Follow up with mom of 32 hour old infant. Infant had just taken a bottle of formula when I went into room. Mom reports pain to left nipple and was noted to have bruising to the upper portion of the nipple. Mom has been placing infant to breast and bottle feeding formula also. Mom's breast are soft and compressible. Nipples are erect and everted. Enc mom to BF infant 8-12 x in 24 hours followed by formula supplementation via bottle. Set up DEBP with instructions for use and cleaning and advised mom to pump every 2-3 hours for 15 minutes on Initiate setting after BF. Enc mom to pump to initiate and maintain supply after BF infant. Mom and Bedside RN report that infant is sleepy at the breast. Advised mom to give all EBM to infant before formula supplementation. Mom has formula supplementation quideline sheet at bedside, reviewed amounts based on day of age.  Enc mom to call for assistance with latch as needed. Will follow up tomorrow and prn.   Maternal Data Formula Feeding for Exclusion: No Has patient been taught Hand Expression?: Yes Does the patient have breastfeeding experience prior to this delivery?: Yes  Feeding    LATCH Score/Interventions                      Lactation Tools Discussed/Used WIC Program: No Pump Review: Setup, frequency, and cleaning;Milk Storage Initiated by:: Noralee StainSharon Cheral Cappucci, RN, IBCLC Date initiated:: 07/04/15   Consult Status Consult Status: Follow-up Date: 07/05/15 Follow-up type: In-patient    Silas FloodSharon S Laurita Peron 07/04/2015, 9:27 AM

## 2015-07-04 NOTE — Plan of Care (Signed)
Problem: Activity: Goal: Ability to tolerate increased activity will improve Outcome: Completed/Met Date Met:  07/04/15 Tolerates walking in room well.

## 2015-07-05 ENCOUNTER — Other Ambulatory Visit: Payer: Self-pay | Admitting: Obstetrics

## 2015-07-05 ENCOUNTER — Encounter: Payer: BC Managed Care – PPO | Admitting: Obstetrics

## 2015-07-05 MED ORDER — HYDROCODONE-ACETAMINOPHEN 5-300 MG PO TABS: 1.0000 | ORAL_TABLET | ORAL | Status: DC | PRN

## 2015-07-05 MED ORDER — MEDROXYPROGESTERONE ACETATE 150 MG/ML IM SUSP
150.0000 mg | Freq: Once | INTRAMUSCULAR | Status: AC
  Administered 2015-07-05: 150 mg via INTRAMUSCULAR
  Filled 2015-07-05: qty 1

## 2015-07-05 MED ORDER — CARVEDILOL 25 MG PO TABS: 25.0000 mg | ORAL_TABLET | Freq: Two times a day (BID) | ORAL | Status: DC

## 2015-07-05 MED ORDER — AMLODIPINE BESYLATE 5 MG PO TABS: 5.0000 mg | ORAL_TABLET | Freq: Every day | ORAL | Status: DC

## 2015-07-05 MED ORDER — IBUPROFEN 600 MG PO TABS: 600.0000 mg | ORAL_TABLET | Freq: Four times a day (QID) | ORAL | Status: DC | PRN

## 2015-07-05 NOTE — Progress Notes (Signed)
Pt verbalizes understanding of d/c instructions medications, follow up appts, when to seek medical attention and belongings policy. I reviewed basic care of herself and her newborn and she verbalizes understanding. IV was d/c without complications prior to d/c. Pt walked to main entrance accompanied by NT. Baby is in carseat at time of d/c. All questions were answered prior to pts d/c. Sheryn BisonGordon, Felipe Paluch Warner

## 2015-07-05 NOTE — Discharge Summary (Signed)
Obstetric Discharge Summary Reason for Admission: rupture of membranes Prenatal Procedures: NST and ultrasound Intrapartum Procedures: spontaneous vaginal delivery Postpartum Procedures: none Complications-Operative and Postpartum: none HEMOGLOBIN  Date Value Ref Range Status  07/03/2015 9.8* 12.0 - 15.0 g/dL Final   HCT  Date Value Ref Range Status  07/03/2015 29.1* 36.0 - 46.0 % Final    Physical Exam:  General: alert and no distress Lochia: appropriate Uterine Fundus: firm Incision: none DVT Evaluation: No evidence of DVT seen on physical exam.  Discharge Diagnoses: Term Pregnancy-delivered and PIH  Discharge Information: Date: 07/05/2015 Activity: pelvic rest Diet: routine Medications: PNV, Ibuprofen, Colace and Vicodin Condition: stable Instructions: refer to practice specific booklet Discharge to: home Follow-up Information    Follow up with Devita Nies A, MD. Schedule an appointment as soon as possible for a visit in 2 weeks.   Specialty:  Obstetrics and Gynecology   Contact information:   [address] [place] [address] [phone]       Newborn Data: Live born female  Birth Weight: 6 lb 0.3 oz (2730 g) APGAR: 7, 9  Home with mother.  Candie Gintz A 07/05/2015, 9:19 AM

## 2015-07-05 NOTE — Progress Notes (Signed)
Post Partum Day 2 Subjective: no complaints  Objective: Blood pressure 152/94, pulse 67, temperature 97.9 F (36.6 C), temperature source Oral, resp. rate 18, height 5\' 7"  (1.702 m), weight 265 lb 11.2 oz (120.521 kg), SpO2 100 %, unknown if currently breastfeeding.  Physical Exam:  General: alert and no distress Lochia: appropriate Uterine Fundus: firm Incision: none DVT Evaluation: No evidence of DVT seen on physical exam.   Recent Labs  07/02/15 1723 07/03/15 0556  HGB 10.2* 9.8*  HCT 30.8* 29.1*    Assessment/Plan: Discharge home   LOS: 3 days   Lander Eslick A 07/05/2015, 9:10 AM

## 2015-07-06 ENCOUNTER — Encounter: Payer: BC Managed Care – PPO | Admitting: Obstetrics

## 2015-07-09 ENCOUNTER — Other Ambulatory Visit (HOSPITAL_COMMUNITY): Payer: BC Managed Care – PPO

## 2015-07-09 ENCOUNTER — Ambulatory Visit (HOSPITAL_COMMUNITY): Payer: BC Managed Care – PPO

## 2015-07-13 ENCOUNTER — Inpatient Hospital Stay (HOSPITAL_COMMUNITY): Admission: RE | Admit: 2015-07-13 | Payer: BC Managed Care – PPO | Source: Ambulatory Visit

## 2015-07-14 ENCOUNTER — Encounter: Payer: Self-pay | Admitting: Obstetrics

## 2015-07-14 ENCOUNTER — Ambulatory Visit (INDEPENDENT_AMBULATORY_CARE_PROVIDER_SITE_OTHER): Payer: BC Managed Care – PPO | Admitting: Obstetrics

## 2015-07-14 ENCOUNTER — Ambulatory Visit: Payer: BC Managed Care – PPO | Admitting: Obstetrics

## 2015-07-14 DIAGNOSIS — IMO0002 Reserved for concepts with insufficient information to code with codable children: Secondary | ICD-10-CM

## 2015-07-14 DIAGNOSIS — O152 Eclampsia in the puerperium: Secondary | ICD-10-CM

## 2015-07-14 MED ORDER — AMLODIPINE BESYLATE 10 MG PO TABS: 10.0000 mg | ORAL_TABLET | Freq: Every day | ORAL | Status: DC

## 2015-07-14 NOTE — Progress Notes (Signed)
Subjective:     Gina Banks is a 40 y.o. female who presents for a postpartum visit. She is 2 weeks postpartum following a spontaneous vaginal delivery. I have fully reviewed the prenatal and intrapartum course. The delivery was at 38 gestational weeks. Outcome: spontaneous vaginal delivery. Anesthesia: none. Postpartum course has been normal. Baby's course has been normal. Baby is feeding by breast. Bleeding thin lochia. Bowel function is normal. Bladder function is normal. Patient is not sexually active. Contraception method is abstinence. Postpartum depression screening: negative.  Tobacco, alcohol and substance abuse history reviewed.  Adult immunizations reviewed including TDAP, rubella and varicella.  The following portions of the patient's history were reviewed and updated as appropriate: allergies, current medications, past family history, past medical history, past social history, past surgical history and problem list.  Review of Systems A comprehensive review of systems was negative.   Objective:    BP 141/104 mmHg  Pulse 97  Temp(Src) 98.6 F (37 C)  Wt 250 lb (113.399 kg)  Breastfeeding? Yes   PE:  Deferred        100% of 15 min visit spent on counseling and coordination of care.  Assessment:    2 weeks postpartum.  Doing well.  Postpartum PIH.  Elevated BP.  Contraceptive counseling and advice.  Plan:    1. Contraception: Undecided 2. BP meds adjusted 3. Follow up in: 2 weeks or as needed.   Healthy lifestyle practices reviewed

## 2015-07-28 ENCOUNTER — Encounter: Payer: Self-pay | Admitting: Obstetrics

## 2015-07-28 ENCOUNTER — Ambulatory Visit (INDEPENDENT_AMBULATORY_CARE_PROVIDER_SITE_OTHER): Payer: BC Managed Care – PPO | Admitting: Obstetrics

## 2015-07-28 DIAGNOSIS — Z3009 Encounter for other general counseling and advice on contraception: Secondary | ICD-10-CM

## 2015-07-29 ENCOUNTER — Encounter: Payer: Self-pay | Admitting: Obstetrics

## 2015-07-29 NOTE — Progress Notes (Signed)
Subjective:     Gina Banks is a 40 y.o. female who presents for a postpartum visit. She is 4 weeks postpartum following a spontaneous vaginal delivery. I have fully reviewed the prenatal and intrapartum course. The delivery was at 38 gestational weeks. Outcome: spontaneous vaginal delivery. Anesthesia: none. Postpartum course has been normal. Baby's course has been normal. Baby is feeding by breast. Bleeding no bleeding. Bowel function is normal. Bladder function is normal. Patient is not sexually active. Contraception method is abstinence. Postpartum depression screening: negative.  Tobacco, alcohol and substance abuse history reviewed.  Adult immunizations reviewed including TDAP, rubella and varicella.  The following portions of the patient's history were reviewed and updated as appropriate: allergies, current medications, past family history, past medical history, past social history, past surgical history and problem list.  Review of Systems A comprehensive review of systems was negative.   Objective:    BP 128/89 mmHg  Pulse 94  Temp(Src) 98.4 F (36.9 C)  Wt 252 lb (114.306 kg)  Breastfeeding? Yes    100% of 15 min visit spent on counseling and coordination of care.   Assessment:    4 weeks postpartum.  Doing well.  Contraceptive counseling and advice  Plan:    1. Contraception: Considering options 2. Continue PNV's 3. Follow up in: 4 weeks or as needed.   Healthy lifestyle practices reviewed

## 2015-08-25 ENCOUNTER — Ambulatory Visit (INDEPENDENT_AMBULATORY_CARE_PROVIDER_SITE_OTHER): Payer: BC Managed Care – PPO | Admitting: Obstetrics

## 2015-08-27 ENCOUNTER — Encounter: Payer: Self-pay | Admitting: Obstetrics

## 2015-08-27 NOTE — Progress Notes (Signed)
Subjective:     Gina Banks is a 40 y.o. female who presents for a postpartum visit. She is 7 weeks postpartum following a spontaneous vaginal delivery. I have fully reviewed the prenatal and intrapartum course. The delivery was at 38 gestational weeks. Outcome: spontaneous vaginal delivery. Anesthesia: none. Postpartum course has been normal. Baby's course has been normal. Baby is feeding by breast. Bleeding no bleeding. Bowel function is normal. Bladder function is normal. Patient is not sexually active. Contraception method is none. Postpartum depression screening: negative.  Tobacco, alcohol and substance abuse history reviewed.  Adult immunizations reviewed including TDAP, rubella and varicella.  The following portions of the patient's history were reviewed and updated as appropriate: allergies, current medications, past family history, past medical history, past social history, past surgical history and problem list.  Review of Systems A comprehensive review of systems was negative.   Objective:    BP 123/90 mmHg  Pulse 86  Wt 262 lb (118.842 kg)  General:  alert and no distress   Breasts:  inspection negative, no nipple discharge or bleeding, no masses or nodularity palpable  Lungs: clear to auscultation bilaterally  Heart:  regular rate and rhythm, S1, S2 normal, no murmur, click, rub or gallop  Abdomen: soft, non-tender; bowel sounds normal; no masses,  no organomegaly   Vulva:  normal  Vagina: normal vagina  Cervix:  no cervical motion tenderness  Corpus: normal size, contour, position, consistency, mobility, non-tender  Adnexa:  no mass, fullness, tenderness  Rectal Exam: Not performed.           Assessment:     Normal postpartum exam. Pap smear not done at today's visit.    Plan:    1. Contraception: none 2. Continue PNV's 3. Follow up in: 6 weeks or as needed.   Healthy lifestyle practices reviewed

## 2015-09-01 ENCOUNTER — Telehealth: Payer: Self-pay | Admitting: *Deleted

## 2015-09-01 NOTE — Telephone Encounter (Signed)
Vitafol Nano is not available at warehouse- can they give something different?

## 2015-09-15 ENCOUNTER — Other Ambulatory Visit: Payer: Self-pay | Admitting: *Deleted

## 2015-09-15 DIAGNOSIS — IMO0002 Reserved for concepts with insufficient information to code with codable children: Secondary | ICD-10-CM

## 2015-09-15 MED ORDER — VITAFOL-ONE 29-1-200 MG PO CAPS: 1.0000 | ORAL_CAPSULE | Freq: Every day | ORAL | Status: DC

## 2015-09-23 ENCOUNTER — Other Ambulatory Visit: Payer: Self-pay | Admitting: *Deleted

## 2015-09-23 ENCOUNTER — Ambulatory Visit: Payer: BC Managed Care – PPO

## 2015-09-23 VITALS — BP 112/64 | HR 72

## 2015-09-23 DIAGNOSIS — Z3042 Encounter for surveillance of injectable contraceptive: Secondary | ICD-10-CM

## 2015-09-23 MED ORDER — MEDROXYPROGESTERONE ACETATE 150 MG/ML IM SUSP: 150.0000 mg | INTRAMUSCULAR | Status: DC

## 2015-09-23 MED ORDER — MEDROXYPROGESTERONE ACETATE 150 MG/ML IM SUSP
150.0000 mg | Freq: Once | INTRAMUSCULAR | Status: AC
  Administered 2015-09-23: 150 mg via INTRAMUSCULAR

## 2015-09-23 NOTE — Progress Notes (Unsigned)
Here today for Depo - last depo on 07/05/15. Pt tolerated shot well. Will return 12/15/15.

## 2015-12-15 ENCOUNTER — Ambulatory Visit (INDEPENDENT_AMBULATORY_CARE_PROVIDER_SITE_OTHER): Payer: BC Managed Care – PPO | Admitting: *Deleted

## 2015-12-15 VITALS — BP 141/97 | HR 87

## 2015-12-15 DIAGNOSIS — Z3042 Encounter for surveillance of injectable contraceptive: Secondary | ICD-10-CM

## 2015-12-15 MED ORDER — MEDROXYPROGESTERONE ACETATE 150 MG/ML IM SUSP
150.0000 mg | Freq: Once | INTRAMUSCULAR | Status: AC
  Administered 2015-12-15: 150 mg via INTRAMUSCULAR

## 2015-12-15 NOTE — Progress Notes (Signed)
Pt is in office today for depo injection. Pt is on time for her injection. Pt tolerated injection well. Pt advised to RTO on 03/07/16 for next depo.  Pt was made aware BP is elevated today. Pt is to check her BP occasionally when at pharmacy.  If BP remains elevated, pt is to follow up with PCP. Pt would like for office to refer her as she does not currently have a PCP. Pt made aware referral will be entered and she will be contacted regarding appt. Pt states understanding.   Administrations This Visit    medroxyPROGESTERone (DEPO-PROVERA) injection 150 mg    Admin Date 12/15/2015 Action Given Dose 150 mg Route Intramuscular Administered By Lanney GinsSuzanne D Pryor Guettler, CMA

## 2015-12-16 ENCOUNTER — Encounter: Payer: Self-pay | Admitting: *Deleted

## 2015-12-16 ENCOUNTER — Telehealth: Payer: Self-pay | Admitting: *Deleted

## 2015-12-16 NOTE — Telephone Encounter (Signed)
Return to letter faxed to job per patient request.

## 2016-03-07 ENCOUNTER — Ambulatory Visit: Payer: Self-pay

## 2016-03-08 ENCOUNTER — Ambulatory Visit (INDEPENDENT_AMBULATORY_CARE_PROVIDER_SITE_OTHER): Payer: BC Managed Care – PPO | Admitting: *Deleted

## 2016-03-08 VITALS — BP 154/99 | HR 91 | Wt 276.7 lb

## 2016-03-08 DIAGNOSIS — Z3042 Encounter for surveillance of injectable contraceptive: Secondary | ICD-10-CM

## 2016-03-08 MED ORDER — MEDROXYPROGESTERONE ACETATE 150 MG/ML IM SUSP
150.0000 mg | INTRAMUSCULAR | Status: AC
  Administered 2016-03-08: 150 mg via INTRAMUSCULAR

## 2016-05-30 ENCOUNTER — Ambulatory Visit (INDEPENDENT_AMBULATORY_CARE_PROVIDER_SITE_OTHER): Payer: BC Managed Care – PPO | Admitting: *Deleted

## 2016-05-30 DIAGNOSIS — Z3042 Encounter for surveillance of injectable contraceptive: Secondary | ICD-10-CM | POA: Diagnosis not present

## 2016-05-30 MED ORDER — MEDROXYPROGESTERONE ACETATE 150 MG/ML IM SUSP
150.0000 mg | Freq: Once | INTRAMUSCULAR | Status: AC
  Administered 2016-05-30: 150 mg via INTRAMUSCULAR

## 2016-08-22 ENCOUNTER — Ambulatory Visit (INDEPENDENT_AMBULATORY_CARE_PROVIDER_SITE_OTHER): Payer: BC Managed Care – PPO

## 2016-08-22 VITALS — Wt 260.0 lb

## 2016-08-22 DIAGNOSIS — Z3042 Encounter for surveillance of injectable contraceptive: Secondary | ICD-10-CM | POA: Diagnosis not present

## 2016-08-22 NOTE — Progress Notes (Signed)
Patient presents for depo provera and within the correct time for this injection. Armandina Stammer RNBSN

## 2016-09-13 ENCOUNTER — Other Ambulatory Visit (INDEPENDENT_AMBULATORY_CARE_PROVIDER_SITE_OTHER): Payer: BC Managed Care – PPO | Admitting: *Deleted

## 2016-09-13 ENCOUNTER — Other Ambulatory Visit: Payer: Self-pay | Admitting: *Deleted

## 2016-09-13 DIAGNOSIS — R8290 Unspecified abnormal findings in urine: Secondary | ICD-10-CM

## 2016-09-13 DIAGNOSIS — R399 Unspecified symptoms and signs involving the genitourinary system: Secondary | ICD-10-CM | POA: Diagnosis not present

## 2016-09-13 LAB — POCT URINALYSIS DIPSTICK
Bilirubin, UA: NEGATIVE
Blood, UA: NEGATIVE
Glucose, UA: NEGATIVE
KETONES UA: NEGATIVE
Nitrite, UA: POSITIVE
PH UA: 6 (ref 5.0–8.0)
Spec Grav, UA: 1.015 (ref 1.010–1.025)
Urobilinogen, UA: NEGATIVE E.U./dL — AB

## 2016-09-13 MED ORDER — NITROFURANTOIN MONOHYD MACRO 100 MG PO CAPS: 100.0000 mg | ORAL_CAPSULE | Freq: Two times a day (BID) | ORAL | 0 refills | Status: DC

## 2016-09-13 NOTE — Progress Notes (Signed)
Pt called to office, was scheduled for UC today. Pt request udip, Results are positive for Nitrites and WBC. Pt made aware will send treatment if advised by Dr Harper. UC willClearance Coots be sent today, pt to be made aware of results once reviewed.  Reviewed udip with Dr Clearance CootsHarper, Macrobid ordered now and review once UC resulted.

## 2016-09-13 NOTE — Progress Notes (Signed)
Pt called to office stating that she is concerned she may have a UTI. Pt states that her urine is very cloudy and has foul odor. Pt made aware that she should push her PO fluids in order to decrease concentration of urine. Pt made aware a UC could be sent in order to rule out UTI. Pt is scheduled to come to office and leave urine sample.  Pt advised she will be called with any other recommendations from Dr Clearance CootsHarper.

## 2016-09-15 ENCOUNTER — Other Ambulatory Visit: Payer: Self-pay | Admitting: Obstetrics

## 2016-09-15 LAB — URINE CULTURE

## 2016-09-15 NOTE — Progress Notes (Signed)
Macrobid Rx 

## 2016-09-17 ENCOUNTER — Other Ambulatory Visit: Payer: Self-pay | Admitting: Obstetrics

## 2016-09-22 NOTE — Progress Notes (Signed)
Pt aware rx is at her pharmacy.

## 2016-10-24 ENCOUNTER — Ambulatory Visit (INDEPENDENT_AMBULATORY_CARE_PROVIDER_SITE_OTHER): Payer: BC Managed Care – PPO | Admitting: Obstetrics

## 2016-10-24 ENCOUNTER — Encounter: Payer: Self-pay | Admitting: Obstetrics

## 2016-10-24 VITALS — BP 131/86 | HR 102 | Wt 278.0 lb

## 2016-10-24 DIAGNOSIS — Z1151 Encounter for screening for human papillomavirus (HPV): Secondary | ICD-10-CM | POA: Diagnosis not present

## 2016-10-24 DIAGNOSIS — Z01419 Encounter for gynecological examination (general) (routine) without abnormal findings: Secondary | ICD-10-CM

## 2016-10-24 DIAGNOSIS — Z124 Encounter for screening for malignant neoplasm of cervix: Secondary | ICD-10-CM

## 2016-10-24 DIAGNOSIS — Z1239 Encounter for other screening for malignant neoplasm of breast: Secondary | ICD-10-CM

## 2016-10-24 NOTE — Progress Notes (Signed)
Subjective:        Gina Banks is a 41 y.o. female here for a routine exam.  Current complaints: None.    Personal health questionnaire:  Is patient Ashkenazi Jewish, have a family history of breast and/or ovarian cancer: yes, mother with Breast CA Is there a family history of uterine cancer diagnosed at age < 2250, gastrointestinal cancer, urinary tract cancer, family member who is a Personnel officerLynch syndrome-associated carrier: no Is the patient overweight and hypertensive, family history of diabetes, personal history of gestational diabetes, preeclampsia or PCOS: no Is patient over 7555, have PCOS,  family history of premature CHD under age 42, diabetes, smoke, have hypertension or peripheral artery disease:  no At any time, has a partner hit, kicked or otherwise hurt or frightened you?: no Over the past 2 weeks, have you felt down, depressed or hopeless?: no Over the past 2 weeks, have you felt little interest or pleasure in doing things?:no   Gynecologic History No LMP recorded. Patient has had an injection. Contraception: Abstinence  Last Pap: 2016. Results were: normal Last mammogram: 2008 . Results were: normal  Obstetric History OB History  Gravida Para Term Preterm AB Living  5 2 2   3 2   SAB TAB Ectopic Multiple Live Births  2 1   0 2    # Outcome Date GA Lbr Len/2nd Weight Sex Delivery Anes PTL Lv  5 Term 07/03/15 3064w0d 00:14 / 00:03 6 lb 0.3 oz (2.73 kg) F Vag-Spont None  LIV  4 SAB 2016     SAB     3 SAB 2015          2 Term 04/13/03 2512w0d  8 lb 9 oz (3.884 kg) M Vag-Spont None  LIV  1 TAB 1990        DEC      Past Medical History:  Diagnosis Date  . Cyst of breast   . Gastric bypass status for obesity   . Herpes   . Hx of cholecystectomy   . Hypertension    on meds now    Past Surgical History:  Procedure Laterality Date  . BREAST CYST EXCISION    . CHOLECYSTECTOMY    . CYST REMOVAL NECK Right   . DILATION AND CURETTAGE OF UTERUS N/A 01/28/2014   Procedure: DILATATION AND CURETTAGE SUCTION WITH CHROMOSOME STUDIES;  Surgeon: Antionette CharLisa Jackson-Moore, MD;  Location: [facility];  Service: Gynecology;  Laterality: N/A;  . Fallopian Tube Cyst    . GASTRIC BYPASS       Current Outpatient Prescriptions:  .  amLODipine (NORVASC) 10 MG tablet, Take 1 tablet (10 mg total) by mouth daily. (Patient not taking: Reported on 03/08/2016), Disp: 30 tablet, Rfl: 11 .  carvedilol (COREG) 25 MG tablet, Take 1 tablet (25 mg total) by mouth 2 (two) times daily with a meal. (Patient not taking: Reported on 03/08/2016), Disp: 60 tablet, Rfl: 11 .  hydrocortisone (ANUSOL-HC) 25 MG suppository, Place 1 suppository (25 mg total) rectally 2 (two) times daily. (Patient not taking: Reported on 03/08/2016), Disp: 24 suppository, Rfl: PRN .  ibuprofen (ADVIL,MOTRIN) 600 MG tablet, Take 1 tablet (600 mg total) by mouth every 6 (six) hours as needed for mild pain. (Patient not taking: Reported on 03/08/2016), Disp: 30 tablet, Rfl: 5 .  medroxyPROGESTERone (DEPO-PROVERA) 150 MG/ML injection, Inject 1 mL (150 mg total) into the muscle every 3 (three) months., Disp: 150 mL, Rfl: 4 .  nitrofurantoin, macrocrystal-monohydrate, (MACROBID) 100 MG capsule, Take  1 capsule (100 mg total) by mouth 2 (two) times daily. 1 po BID x 7days, Disp: 14 capsule, Rfl: 0 .  Prenatal Vit-FePoly-FA-DHA (VITAFOL-ONE) 29-1-200 MG CAPS, Take 1 capsule by mouth daily. (Patient not taking: Reported on 03/08/2016), Disp: 30 capsule, Rfl: 11 .  Prenatal-Fe Fum-Methf-FA w/o A (VITAFOL-NANO) 18-0.6-0.4 MG TABS, Take 1 tablet by mouth daily. (Patient not taking: Reported on 03/08/2016), Disp: 30 tablet, Rfl: 11  Current Facility-Administered Medications:  .  medroxyPROGESTERone (DEPO-PROVERA) injection 150 mg, 150 mg, Intramuscular, Q90 days, Brock Bad, MD, 150 mg at 03/08/16 1550 No Known Allergies  Social History  Substance Use Topics  . Smoking status: Never Smoker  . Smokeless tobacco: Never Used  . Alcohol  use No    Family History  Problem Relation Age of Onset  . Diabetes Father   . Hypertension Father   . Cancer Mother   . Heart attack Paternal Grandmother       Review of Systems  Constitutional: negative for fatigue and weight loss Respiratory: negative for cough and wheezing Cardiovascular: negative for chest pain, fatigue and palpitations Gastrointestinal: negative for abdominal pain and change in bowel habits Musculoskeletal:negative for myalgias Neurological: negative for gait problems and tremors Behavioral/Psych: negative for abusive relationship, depression Endocrine: negative for temperature intolerance    Genitourinary:negative for abnormal menstrual periods, genital lesions, hot flashes, sexual problems and vaginal discharge Integument/breast: negative for breast lump, breast tenderness, nipple discharge and skin lesion(s)    Objective:       BP 131/86   Pulse (!) 102   Wt 278 lb (126.1 kg)   BMI 43.54 kg/m  General:   alert  Skin:   no rash or abnormalities  Lungs:   clear to auscultation bilaterally  Heart:   regular rate and rhythm, S1, S2 normal, no murmur, click, rub or gallop  Breasts:   normal without suspicious masses, skin or nipple changes or axillary nodes  Abdomen:  normal findings: no organomegaly, soft, non-tender and no hernia  Pelvis:  External genitalia: normal general appearance Urinary system: urethral meatus normal and bladder without fullness, nontender Vaginal: normal without tenderness, induration or masses Cervix: normal appearance Adnexa: normal bimanual exam Uterus: anteverted and non-tender, normal size   Lab Review Urine pregnancy test Labs reviewed yes Radiologic studies reviewed yes  50% of 20 min visit spent on counseling and coordination of care.    Assessment:    Healthy female exam.    Plan:    Education reviewed: calcium supplements, depression evaluation, low fat, low cholesterol diet, safe sex/STD prevention,  self breast exams and weight bearing exercise. Contraception: abstinence. Mammogram ordered. Follow up in: 1 year.   No orders of the defined types were placed in this encounter.  Orders Placed This Encounter  Procedures  . MM Digital Screening    Standing Status:   Future    Standing Expiration Date:   12/24/2017    Order Specific Question:   Reason for Exam (SYMPTOM  OR DIAGNOSIS REQUIRED)    Answer:   Screening.    Order Specific Question:   Is the patient pregnant?    Answer:   No    Order Specific Question:   Preferred imaging location?    Answer:   [facility]     Patient ID: DANYIEL CRESPIN, female   DOB: [birth year], 41 y.o.   MRN: [id number]

## 2016-10-25 LAB — CERVICOVAGINAL ANCILLARY ONLY
BACTERIAL VAGINITIS: NEGATIVE
CANDIDA VAGINITIS: NEGATIVE

## 2016-10-25 LAB — CYTOLOGY - PAP
Diagnosis: NEGATIVE
HPV: NOT DETECTED

## 2016-11-15 ENCOUNTER — Other Ambulatory Visit: Payer: Self-pay | Admitting: Obstetrics

## 2016-11-15 ENCOUNTER — Other Ambulatory Visit: Payer: Self-pay | Admitting: *Deleted

## 2016-11-15 MED ORDER — MEDROXYPROGESTERONE ACETATE 150 MG/ML IM SUSP
150.0000 mg | INTRAMUSCULAR | 4 refills | Status: DC
Start: 2016-11-15 — End: 2017-12-30

## 2016-11-15 MED ORDER — MEDROXYPROGESTERONE ACETATE 150 MG/ML IM SUSP: 150.0000 mg | INTRAMUSCULAR | 4 refills | Status: DC

## 2016-11-15 NOTE — Progress Notes (Unsigned)
Rx reordered- change in pharmacy

## 2016-11-16 ENCOUNTER — Other Ambulatory Visit: Payer: Self-pay | Admitting: Obstetrics

## 2016-11-16 ENCOUNTER — Ambulatory Visit (INDEPENDENT_AMBULATORY_CARE_PROVIDER_SITE_OTHER): Payer: BC Managed Care – PPO | Admitting: *Deleted

## 2016-11-16 VITALS — BP 126/82 | HR 98

## 2016-11-16 DIAGNOSIS — Z3042 Encounter for surveillance of injectable contraceptive: Secondary | ICD-10-CM | POA: Diagnosis not present

## 2016-11-16 MED ORDER — MEDROXYPROGESTERONE ACETATE 150 MG/ML IM SUSP
150.0000 mg | Freq: Once | INTRAMUSCULAR | Status: AC
  Administered 2016-11-16: 150 mg via INTRAMUSCULAR

## 2016-11-16 NOTE — Progress Notes (Signed)
Pt is in office today for depo injection. Pt is on time for depo. Pt provided depo today. Pt advised to RTO on 02/07/17 for next depo.   Administrations This Visit    medroxyPROGESTERone (DEPO-PROVERA) injection 150 mg    Admin Date 11/16/2016 Action Given Dose 150 mg Route Intramuscular Administered By Lanney GinsFoster, Suriya Kovarik D, CMA

## 2017-01-18 ENCOUNTER — Ambulatory Visit
Admission: RE | Admit: 2017-01-18 | Discharge: 2017-01-18 | Disposition: A | Discharge status: Home / Self Care | Payer: BC Managed Care – PPO | Source: Ambulatory Visit | Attending: Obstetrics | Admitting: Obstetrics

## 2017-01-18 DIAGNOSIS — Z1239 Encounter for other screening for malignant neoplasm of breast: Secondary | ICD-10-CM

## 2017-02-06 ENCOUNTER — Ambulatory Visit (INDEPENDENT_AMBULATORY_CARE_PROVIDER_SITE_OTHER): Payer: BC Managed Care – PPO | Admitting: *Deleted

## 2017-02-06 VITALS — BP 139/85 | HR 102

## 2017-02-06 DIAGNOSIS — Z3042 Encounter for surveillance of injectable contraceptive: Secondary | ICD-10-CM | POA: Diagnosis not present

## 2017-02-06 MED ORDER — MEDROXYPROGESTERONE ACETATE 150 MG/ML IM SUSP
150.0000 mg | Freq: Once | INTRAMUSCULAR | Status: AC
  Administered 2017-02-06: 150 mg via INTRAMUSCULAR

## 2017-02-06 NOTE — Progress Notes (Signed)
Pt is in office for depo injection.  Pt tolerated well. Pt advised to RTO 12/25-1/8 for next depo. Pt has no other concerns today.  BP 139/85   Pulse (!) 102   Administrations This Visit    medroxyPROGESTERone (DEPO-PROVERA) injection 150 mg    Admin Date 02/06/2017 Action Given Dose 150 mg Route Intramuscular Administered By Lanney Gins, CMA

## 2017-02-16 IMAGING — US US MFM OB DETAIL+14 WK
2 series · 13 of 28 positions shown · non-contrast
Comparison: none

[Series 1: us mfm ob detail+14 wk · 11 of 91 slices shown (1 of 2)]
[im 4/91]
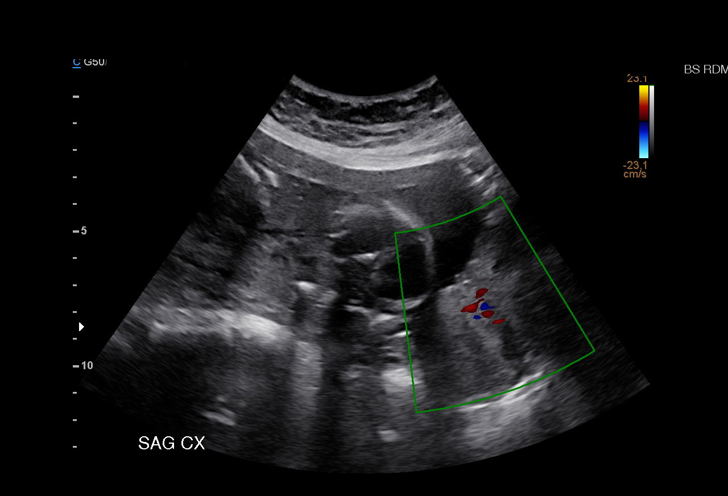
[im 12/91]
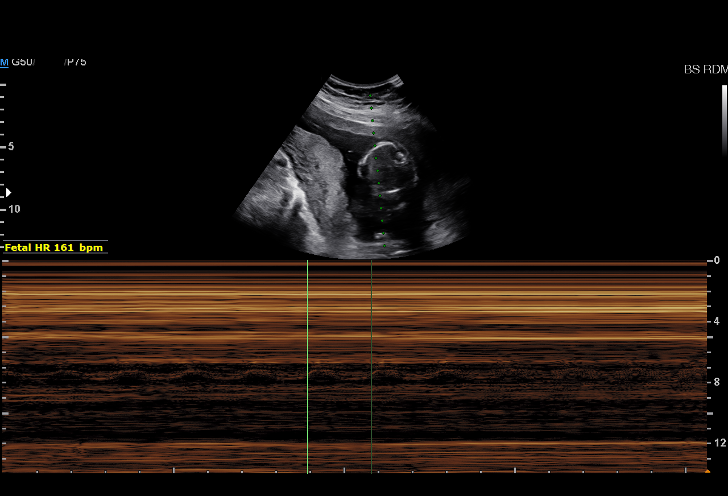
[im 20/91]
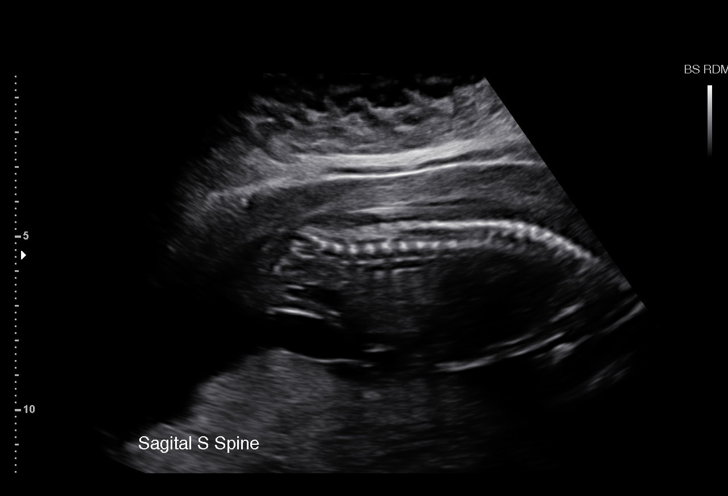
[im 28/91]
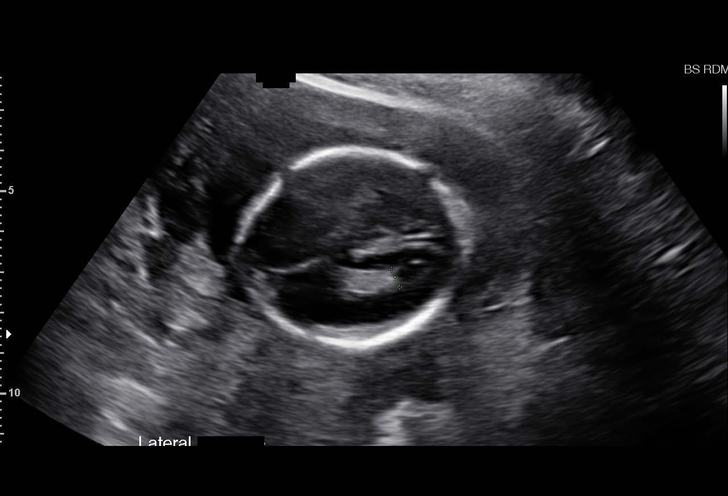
[im 36/91]
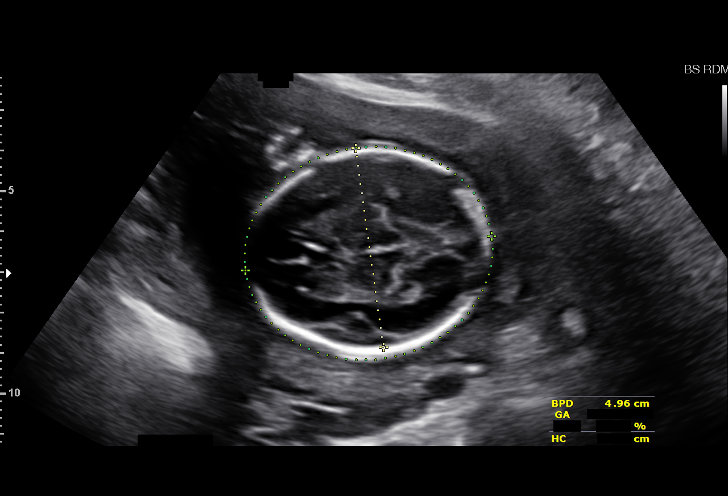
[im 44/91]
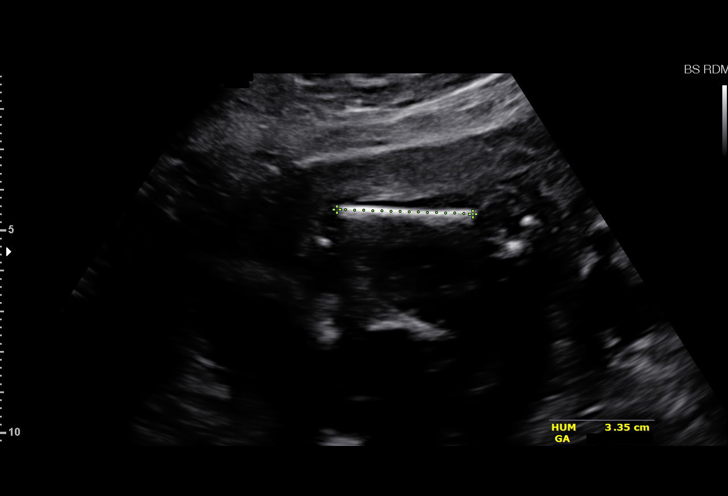
[im 55/91]
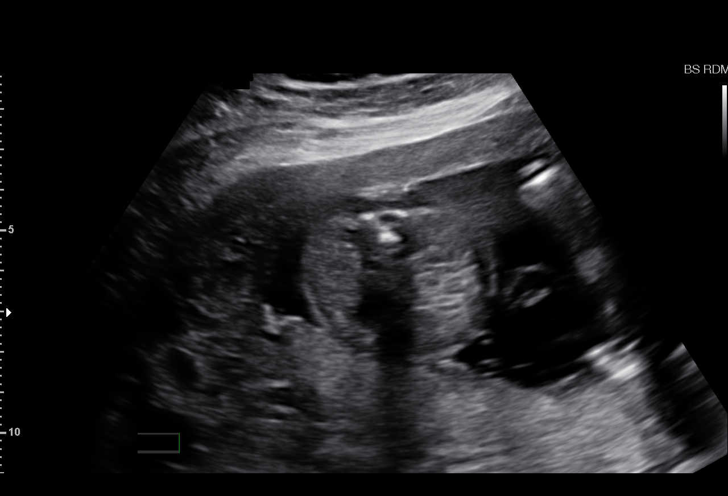
[im 63/91]
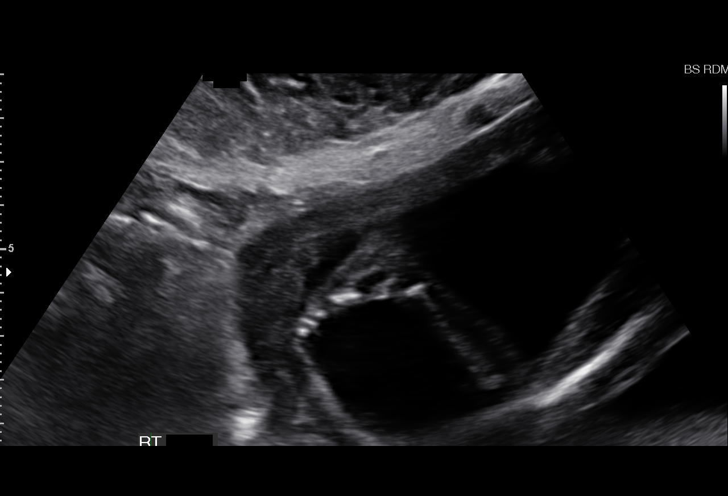
[im 71/91]
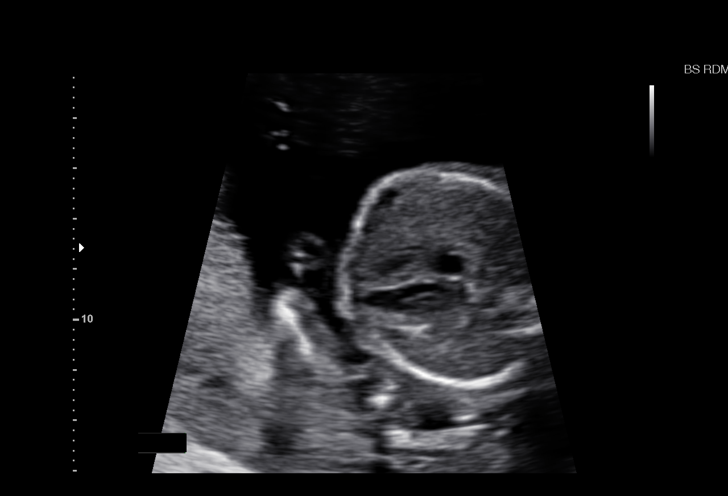
[im 79/91]
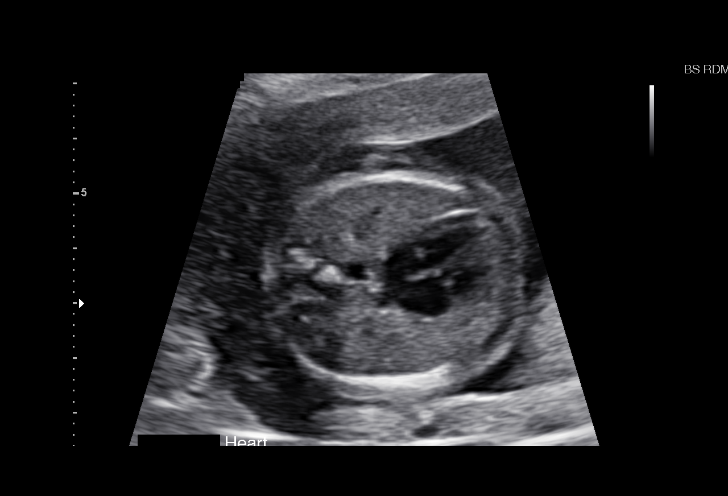
[im 87/91]
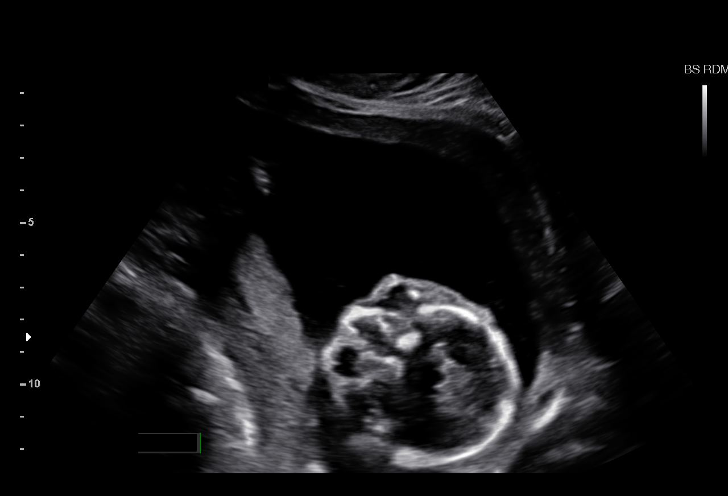

[Series 2: us mfm ob detail+14 wk · 15 acquisitions, 2 frames shown (2 of 2)]
[im 1/15]
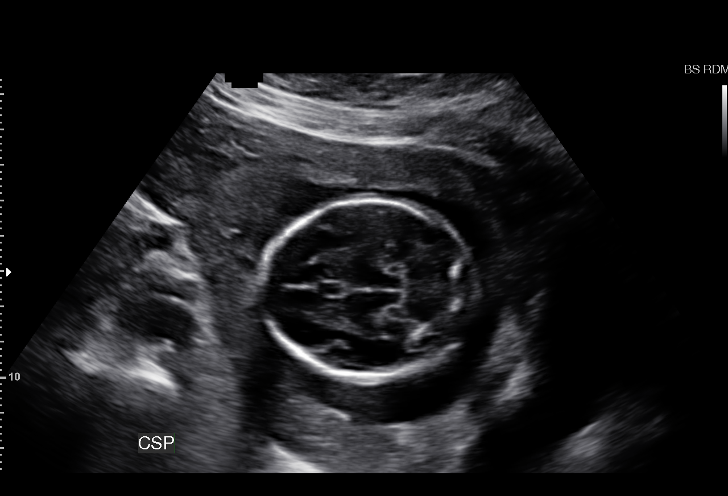
[im 10/15]
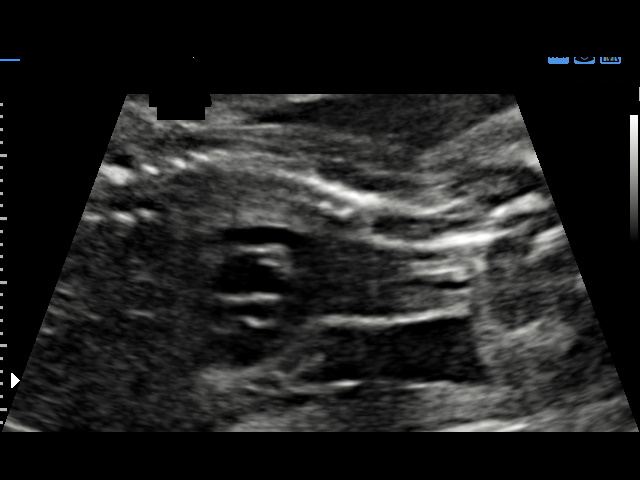

[13 of 28 positions shown; findings below may reference images not displayed]

OBSTETRICS REPORT
(Signed Final 03/03/2015 [DATE])

Name:       QUIRIJN AMAZIGH                 Visit  03/03/2015 [DATE]
Date:

Service(s) Provided

Indications

Detailed fetal anatomic survey                         Z36
Advanced maternal age multigravida 35+ (40),
second trimester
Obesity complicating pregnancy, second
trimester
History of Infertility (Progesterone only)
Pregnancy complicated by previous gastric
bypass, antepartum, second trimester
Hypertension - Chronic/Pre-existing (per notes
stress induced)
20 weeks gestation of pregnancy
Abnormal biochemical screen (quad) for Trisomy
21
Fetal Evaluation

Num Of             1
Fetuses:
Fetal Heart        161                          bpm
Rate:
Cardiac Activity:  Observed
Presentation:      Cephalic
Placenta:          Posterior, above cervical
os
P. Cord            Visualized, central
Insertion:

Amniotic Fluid
AFI FV:      Subjectively within normal limits
Larg Pckt:      4.6  cm
Biometry

BPD:     49.6   m    G. Age:   21w 0d                 CI:        76.25   70 - 86
m
FL/HC:      18.6   15.9 -
20.3
HC:       180   m    G. Age:   20w 3d        36  %    HC/AC:      1.17   1.06 -
m
AC:     154.1   m    G. Age:   20w 4d        45  %    FL/BPD
m                                     :
FL:      33.4   m    G. Age:   20w 3d        38  %    FL/AC:      21.7   20 - 24
m
HUM:     33.7   m    G. Age:   21w 3d        72  %
m
CER:     19.8   m    G. Age:   18w 6d        10  %
m
NFT:       4.5  m
m

Est.         361   gm   0 lb 13 oz      46   %
FW:
Gestational Age

U/S Today:     20w 4d                                         EDD:   07/17/15
Best:          20w 4d    Det. By:   Early Ultrasound          EDD:   07/17/15
(12/08/14)
Anatomy

Cranium:          Appears normal         Aortic Arch:       Appears normal
Fetal Cavum:      Appears normal         Ductal Arch:       Appears normal
Ventricles:       Appears normal         Diaphragm:         Appears normal
Choroid Plexus:   Appears normal         Stomach:           Appears normal,
left sided
Cerebellum:       Appears normal         Abdomen:           Appears normal
Posterior         Appears normal         Abdominal          Appears nml (cord
Fossa:                                   Wall:              insert, abd wall)
Nuchal Fold:      Not applicable (>20    Cord Vessels:      Appears normal (3
wks GA)                                   vessel cord)
Face:             Orbits nl; profile     Kidneys:           Appear normal
not well visualized
Lips:             Appears normal         Bladder:           Appears normal
Palate:           Not well visualized    Spine:             Appears normal
Heart:            Appears normal         Lower              Appears normal
(4CH, axis, and        Extremities:
situs)
RVOT:             Appears normal         Upper              Appears normal
Extremities:
LVOT:             Appears normal

Other:   Fetus appears to be a female. Heels and 5th digit visualized.
Technically difficult due to fetal position.
Targeted Anatomy

Fetal Central Nervous System
Lat. Ventricles:  5.7                    Cisterna
Magna:
Cervix Uterus Adnexa
Cervical Length:    3.4       cm
Cervix:       Normal appearance by transabdominal scan.

Left Ovary:    Within normal limits.
Right Ovary:   Not visualized.

Adnexa:     No abnormality visualized.
Impression

SIUP at 20+4 weeks
Normal detailed fetal anatomy; limited views of profile due
to fetal position
Markers of aneuploidy: none
Normal amniotic fluid volume
Measurements consistent with early US

The US findings were shared with Ms. Maribel.  The
implications of a normal detailed US on her T21 risk were
discussed in detail. All of her prenatal testing options were
reviewed. After careful consideration, she declined
amniocentesis and NIPS.
Recommendations

Please see genetic counseling letter
Serial Abenk for growth (AMA, BMI,?cHTN)

MILAZZO with us.  Please do not hesitate to contact

## 2017-04-27 ENCOUNTER — Ambulatory Visit: Payer: BC Managed Care – PPO

## 2017-05-02 ENCOUNTER — Ambulatory Visit (INDEPENDENT_AMBULATORY_CARE_PROVIDER_SITE_OTHER): Payer: BC Managed Care – PPO | Admitting: Pediatrics

## 2017-05-02 DIAGNOSIS — Z3042 Encounter for surveillance of injectable contraceptive: Secondary | ICD-10-CM | POA: Diagnosis not present

## 2017-05-02 MED ORDER — MEDROXYPROGESTERONE ACETATE 150 MG/ML IM SUSP
150.0000 mg | Freq: Once | INTRAMUSCULAR | Status: AC
  Administered 2017-05-02: 150 mg via INTRAMUSCULAR

## 2017-05-02 NOTE — Progress Notes (Signed)
Patient here for depo-provera  Agree with nursing staff's documentation of this patient's clinic encounter.  Catalina AntiguaPeggy Hardeep Reetz, MD 05/02/2017 11:08 AM

## 2017-06-11 ENCOUNTER — Other Ambulatory Visit: Payer: Self-pay

## 2017-06-11 ENCOUNTER — Telehealth: Payer: Self-pay

## 2017-06-11 NOTE — Telephone Encounter (Signed)
Returned call and pt stated that she needs a refill on valtrex because she has been under a lot of stress and is having a very bad breakout. Pt also wanted a referral for a PCP to monitor her blood pressure.

## 2017-06-12 ENCOUNTER — Telehealth: Payer: Self-pay

## 2017-06-12 ENCOUNTER — Other Ambulatory Visit: Payer: Self-pay | Admitting: Obstetrics

## 2017-06-12 DIAGNOSIS — Z0000 Encounter for general adult medical examination without abnormal findings: Secondary | ICD-10-CM

## 2017-06-12 DIAGNOSIS — A6009 Herpesviral infection of other urogenital tract: Secondary | ICD-10-CM

## 2017-06-12 MED ORDER — VALACYCLOVIR HCL 1 G PO TABS: 1000.0000 mg | ORAL_TABLET | Freq: Two times a day (BID) | ORAL | 5 refills | Status: DC

## 2017-06-12 NOTE — Telephone Encounter (Signed)
Valtrex Rx for BV Referral made to Internal Medicine for Hypertention

## 2017-06-12 NOTE — Telephone Encounter (Signed)
Returned call, no answer, left vm 

## 2017-07-26 ENCOUNTER — Ambulatory Visit (INDEPENDENT_AMBULATORY_CARE_PROVIDER_SITE_OTHER): Payer: BC Managed Care – PPO | Admitting: *Deleted

## 2017-07-26 VITALS — BP 136/89 | HR 90

## 2017-07-26 DIAGNOSIS — Z3042 Encounter for surveillance of injectable contraceptive: Secondary | ICD-10-CM

## 2017-07-26 MED ORDER — MEDROXYPROGESTERONE ACETATE 150 MG/ML IM SUSP
150.0000 mg | Freq: Once | INTRAMUSCULAR | Status: AC
  Administered 2017-07-26: 150 mg via INTRAMUSCULAR

## 2017-07-26 NOTE — Progress Notes (Signed)
I have reviewed the chart and agree with nursing staff's documentation of this patient's encounter.  Catalina AntiguaPeggy Jejuan Scala, MD 07/26/2017 4:36 PM

## 2017-07-26 NOTE — Progress Notes (Signed)
Pt is in office for Depo injection.  Pt is on time for injection.  Pt tolerated injection well.  Pt has no other concerns today. Pt advised to RTO 6/13-27 for next depo   Administrations This Visit    medroxyPROGESTERone (DEPO-PROVERA) injection 150 mg    Admin Date 07/26/2017 Action Given Dose 150 mg Route Intramuscular Administered By Lanney GinsFoster, Daivik Overley D, CMA

## 2017-10-23 ENCOUNTER — Ambulatory Visit (INDEPENDENT_AMBULATORY_CARE_PROVIDER_SITE_OTHER): Payer: BC Managed Care – PPO | Admitting: *Deleted

## 2017-10-23 VITALS — Wt 280.0 lb

## 2017-10-23 DIAGNOSIS — Z3042 Encounter for surveillance of injectable contraceptive: Secondary | ICD-10-CM | POA: Diagnosis not present

## 2017-10-23 MED ORDER — MEDROXYPROGESTERONE ACETATE 150 MG/ML IM SUSP
150.0000 mg | Freq: Once | INTRAMUSCULAR | Status: AC
  Administered 2017-10-23: 150 mg via INTRAMUSCULAR

## 2017-10-23 NOTE — Progress Notes (Signed)
I have reviewed the chart and agree with nursing staff's documentation of this patient's encounter.  Roe CoombsRachelle A Dalessandro Baldyga, CNM 10/23/2017 1:45 PM

## 2017-10-23 NOTE — Progress Notes (Signed)
Pt is in office for depo injection. Pt is on time for injection. Pt tolerated injection well. Pt advised to RTO on 01/14/18 for next depo. Pt made aware she is due for AEX anytime after 10/24/17. Pt has no other concerns today.

## 2017-11-12 ENCOUNTER — Ambulatory Visit: Payer: BC Managed Care – PPO | Admitting: Obstetrics

## 2017-11-12 ENCOUNTER — Other Ambulatory Visit: Payer: Self-pay | Admitting: Obstetrics

## 2017-11-12 ENCOUNTER — Telehealth: Payer: Self-pay

## 2017-11-12 VITALS — BP 132/80 | HR 80 | Temp 98.4°F | Wt 281.1 lb

## 2017-11-12 DIAGNOSIS — N390 Urinary tract infection, site not specified: Secondary | ICD-10-CM | POA: Diagnosis not present

## 2017-11-12 DIAGNOSIS — R399 Unspecified symptoms and signs involving the genitourinary system: Secondary | ICD-10-CM

## 2017-11-12 LAB — POCT URINALYSIS DIPSTICK
Blood, UA: NEGATIVE
GLUCOSE UA: NEGATIVE
Nitrite, UA: NEGATIVE
PH UA: 5 (ref 5.0–8.0)
Protein, UA: POSITIVE — AB
Spec Grav, UA: 1.015 (ref 1.010–1.025)
UROBILINOGEN UA: 0.2 U/dL

## 2017-11-12 MED ORDER — NITROFURANTOIN MONOHYD MACRO 100 MG PO CAPS: 100.0000 mg | ORAL_CAPSULE | Freq: Two times a day (BID) | ORAL | 0 refills | Status: DC

## 2017-11-12 NOTE — Progress Notes (Signed)
I have reviewed the chart and agree with nursing staff's documentation and management of this patient's encounter.  Coral Ceoharles Jassmine Vandruff, MD 11/12/2017 12:17 PM

## 2017-11-12 NOTE — Telephone Encounter (Signed)
Pt called stating she is having UTI symptoms. Pt has not been seen for annual this year. I called pt back and left her a voicemail stating she could call office back to make an appointment for UTI symptoms only or if we were able to see her for annual at the same time to go ahead and schedule that.

## 2017-11-12 NOTE — Progress Notes (Signed)
Presents for Urine Culture. C/o lower abdominal pain   SUBJECTIVE: Gina Banks is a 42 y.o. female who complains of urinary frequency, urgency and dysuria x 2 weeks days, lower abdominal pain that radiates to her back.  No fever, chills, NV or abnormal vaginal discharge or bleeding.   OBJECTIVE: Appears well, in no apparent distress.  Vital signs are normal. Urine dipstick shows positive for ketones, +Leuko, and +Protein    ASSESSMENT: Dysuria/UTI  PLAN: Treatment per orders Rx Macrobid sent to pharmacy. Call or return to clinic prn if these symptoms worsen or fail to improve as anticipated.

## 2017-11-14 LAB — URINE CULTURE

## 2017-11-27 ENCOUNTER — Ambulatory Visit: Payer: BC Managed Care – PPO | Admitting: Obstetrics

## 2017-12-10 ENCOUNTER — Ambulatory Visit: Payer: BC Managed Care – PPO | Admitting: Obstetrics

## 2017-12-30 ENCOUNTER — Other Ambulatory Visit: Payer: Self-pay | Admitting: Obstetrics

## 2018-01-15 ENCOUNTER — Ambulatory Visit: Payer: BC Managed Care – PPO

## 2018-01-22 ENCOUNTER — Ambulatory Visit (INDEPENDENT_AMBULATORY_CARE_PROVIDER_SITE_OTHER): Payer: BC Managed Care – PPO

## 2018-01-22 DIAGNOSIS — Z3042 Encounter for surveillance of injectable contraceptive: Secondary | ICD-10-CM

## 2018-01-22 MED ORDER — MEDROXYPROGESTERONE ACETATE 150 MG/ML IM SUSP
150.0000 mg | INTRAMUSCULAR | Status: DC
  Administered 2018-01-22 – 2019-06-16 (×5): 150 mg via INTRAMUSCULAR

## 2018-01-22 NOTE — Progress Notes (Signed)
Patient is in the office for depo injection, administered and pt tolerated well. Next depo due Dec 10- Dec 24. .. Administrations This Visit    medroxyPROGESTERone (DEPO-PROVERA) injection 150 mg    Admin Date 01/22/2018 Action Given Dose 150 mg Route Intramuscular Administered By Guynell Kleiber D, RN         

## 2018-02-21 ENCOUNTER — Ambulatory Visit: Payer: BC Managed Care – PPO | Admitting: Obstetrics

## 2018-04-22 ENCOUNTER — Ambulatory Visit (INDEPENDENT_AMBULATORY_CARE_PROVIDER_SITE_OTHER): Payer: BC Managed Care – PPO

## 2018-04-22 VITALS — BP 139/88 | HR 80 | Wt 283.2 lb

## 2018-04-22 DIAGNOSIS — Z3042 Encounter for surveillance of injectable contraceptive: Secondary | ICD-10-CM

## 2018-04-22 MED ORDER — MEDROXYPROGESTERONE ACETATE 150 MG/ML IM SUSP
150.0000 mg | Freq: Once | INTRAMUSCULAR | Status: AC
  Administered 2018-04-22: 150 mg via INTRAMUSCULAR

## 2018-04-22 NOTE — Progress Notes (Signed)
Presents for DEPO, given in LD, tolerated well.  Next DEPO 03/10-24/2020  Administrations This Visit    medroxyPROGESTERone (DEPO-PROVERA) injection 150 mg    Admin Date 04/22/2018 Action Given Dose 150 mg Route Intramuscular Administered By Maretta BeesMcGlashan, Delwyn Scoggin J, RMA

## 2018-07-10 ENCOUNTER — Ambulatory Visit: Payer: BC Managed Care – PPO

## 2018-07-11 ENCOUNTER — Encounter: Payer: Self-pay | Admitting: Obstetrics

## 2018-07-11 ENCOUNTER — Other Ambulatory Visit: Payer: Self-pay

## 2018-07-11 ENCOUNTER — Ambulatory Visit (INDEPENDENT_AMBULATORY_CARE_PROVIDER_SITE_OTHER): Payer: BC Managed Care – PPO | Admitting: Obstetrics

## 2018-07-11 VITALS — BP 157/110 | HR 105 | Ht 67.0 in | Wt 277.0 lb

## 2018-07-11 DIAGNOSIS — Z3042 Encounter for surveillance of injectable contraceptive: Secondary | ICD-10-CM | POA: Diagnosis not present

## 2018-07-11 DIAGNOSIS — Z6841 Body Mass Index (BMI) 40.0 and over, adult: Secondary | ICD-10-CM

## 2018-07-11 DIAGNOSIS — I10 Essential (primary) hypertension: Secondary | ICD-10-CM

## 2018-07-11 MED ORDER — AMLODIPINE BESYLATE 5 MG PO TABS: 5.0000 mg | ORAL_TABLET | Freq: Every day | ORAL | 11 refills | Status: DC

## 2018-07-11 MED ORDER — CARVEDILOL 25 MG PO TABS: 25.0000 mg | ORAL_TABLET | Freq: Two times a day (BID) | ORAL | 11 refills | Status: DC

## 2018-07-11 NOTE — Progress Notes (Signed)
Pt is in office for Depo today. Pt is on time for Depo. Injection given, pt tolerated well.  Pt has increase BP in office today.  Was added to Dr Clearance Coots schedule to address BP concerns. Pt advised to schedule AEX and RTO 5/28-6/03/2019 for next depo.  BP (!) 157/110   Pulse (!) 105   Ht 5\' 7"  (1.702 m)   Wt 277 lb (125.6 kg)   BMI 43.38 kg/m     Administrations This Visit    medroxyPROGESTERone (DEPO-PROVERA) injection 150 mg    Admin Date 07/11/2018 Action Given Dose 150 mg Route Intramuscular Administered By Lanney Gins, CMA

## 2018-07-11 NOTE — Progress Notes (Signed)
Subjective:    Gina Banks is a 43 y.o. female who presents for contraception counseling. The patient has no complaints today. The patient is sexually active. Pertinent past medical history: hypertension and obesity.  The information documented in the HPI was reviewed and verified.  Menstrual History: OB History    Gravida  5   Para  2   Term  2   Preterm      AB  3   Living  2     SAB  2   TAB  1   Ectopic      Multiple  0   Live Births  2            No LMP recorded. Patient has had an injection.   Patient Active Problem List   Diagnosis Date Noted  . NSVD (normal spontaneous vaginal delivery) 07/03/2015  . Active labor 07/02/2015  . [redacted] weeks gestation of pregnancy   . Advanced maternal age in multigravida   . Hereditary disease in family possibly affecting fetus, affecting management of mother, antepartum condition or complication   . Superficial fungus infection of skin 03/16/2014  . Candida vaginitis 03/16/2014  . Missed abortion 01/24/2014  . Elderly multigravida 01/22/2014  . Unspecified vitamin D deficiency 06/07/2013  . Obesity complicating pregnancy 06/28/2009   Past Medical History:  Diagnosis Date  . Cyst of breast   . Gastric bypass status for obesity   . Herpes   . Hx of cholecystectomy   . Hypertension    on meds now    Past Surgical History:  Procedure Laterality Date  . BREAST CYST EXCISION    . BREAST EXCISIONAL BIOPSY Right 1992  . CHOLECYSTECTOMY    . CYST REMOVAL NECK Right   . DILATION AND CURETTAGE OF UTERUS N/A 01/28/2014   Procedure: DILATATION AND CURETTAGE SUCTION WITH CHROMOSOME STUDIES;  Surgeon: Antionette Char, MD;  Location: [facility];  Service: Gynecology;  Laterality: N/A;  . Fallopian Tube Cyst    . GASTRIC BYPASS       Current Outpatient Medications:  .  amLODipine (NORVASC) 5 MG tablet, Take 1 tablet (5 mg total) by mouth daily., Disp: 30 tablet, Rfl: 11 .  carvedilol (COREG) 25 MG tablet, Take 1  tablet (25 mg total) by mouth 2 (two) times daily with a meal., Disp: 60 tablet, Rfl: 11 .  hydrocortisone (ANUSOL-HC) 25 MG suppository, Place 1 suppository (25 mg total) rectally 2 (two) times daily. (Patient not taking: Reported on 03/08/2016), Disp: 24 suppository, Rfl: PRN .  ibuprofen (ADVIL,MOTRIN) 600 MG tablet, Take 1 tablet (600 mg total) by mouth every 6 (six) hours as needed for mild pain. (Patient not taking: Reported on 03/08/2016), Disp: 30 tablet, Rfl: 5 .  medroxyPROGESTERone (DEPO-PROVERA) 150 MG/ML injection, INJECT 1 ML INTO THE MUSCLE EVERY 3 MONTHS., Disp: 1 mL, Rfl: 3 .  MedroxyPROGESTERone Acetate 150 MG/ML SUSY, INJECT 1 ML INTO THE MUSCLE EVERY 3 MONTHS., Disp: 1 Syringe, Rfl: 3 .  nitrofurantoin, macrocrystal-monohydrate, (MACROBID) 100 MG capsule, Take 1 capsule (100 mg total) by mouth 2 (two) times daily. 1 po BID x 7days, Disp: 14 capsule, Rfl: 0 .  Prenatal Vit-FePoly-FA-DHA (VITAFOL-ONE) 29-1-200 MG CAPS, Take 1 capsule by mouth daily. (Patient not taking: Reported on 03/08/2016), Disp: 30 capsule, Rfl: 11 .  Prenatal-Fe Fum-Methf-FA w/o A (VITAFOL-NANO) 18-0.6-0.4 MG TABS, Take 1 tablet by mouth daily. (Patient not taking: Reported on 03/08/2016), Disp: 30 tablet, Rfl: 11 .  valACYclovir (VALTREX) 1000 MG  tablet, Take 1 tablet (1,000 mg total) by mouth 2 (two) times daily., Disp: 30 tablet, Rfl: 5  Current Facility-Administered Medications:  .  medroxyPROGESTERone (DEPO-PROVERA) injection 150 mg, 150 mg, Intramuscular, Q90 days, Adam Phenix, MD, 150 mg at 07/11/18 1444 No Known Allergies  Social History   Tobacco Use  . Smoking status: Never Smoker  . Smokeless tobacco: Never Used  Substance Use Topics  . Alcohol use: No    Alcohol/week: 0.0 standard drinks    Family History  Problem Relation Age of Onset  . Diabetes Father   . Hypertension Father   . Cancer Mother   . Heart attack Paternal Grandmother        Review of Systems Constitutional: negative  for weight loss Genitourinary:negative for abnormal menstrual periods and vaginal discharge   Objective:   BP (!) 157/110   Pulse (!) 105   Ht 5\' 7"  (1.702 m)   Wt 277 lb (125.6 kg)   BMI 43.38 kg/m    PE:  Deferred  Lab Review Urine pregnancy test Labs reviewed yes Radiologic studies reviewed no  50% of 10 min visit spent on counseling and coordination of care.    Assessment:    43 y.o., continuing Depo-Provera injections, no contraindications.   Plan:   1. Surveillance for Depo-Provera contraception - pleased with Depo Provera  2. HTN (hypertension), benign Rx: - carvedilol (COREG) 25 MG tablet; Take 1 tablet (25 mg total) by mouth 2 (two) times daily with a meal.  Dispense: 60 tablet; Refill: 11 - amLODipine (NORVASC) 5 MG tablet; Take 1 tablet (5 mg total) by mouth daily.  Dispense: 30 tablet; Refill: 11 - Ambulatory referral to Internal Medicine  3. Class 3 severe obesity due to excess calories without serious comorbidity with body mass index (BMI) of 40.0 to 44.9 in adult [facility] [place]) - program of caloric reduction, exercise and behavioral modification recommended    All questions answered. Contraception: Depo-Provera injections. Discussed healthy lifestyle modifications. Follow up in 3 months.   Meds ordered this encounter  Medications  . carvedilol (COREG) 25 MG tablet    Sig: Take 1 tablet (25 mg total) by mouth 2 (two) times daily with a meal.    Dispense:  60 tablet    Refill:  11  . amLODipine (NORVASC) 5 MG tablet    Sig: Take 1 tablet (5 mg total) by mouth daily.    Dispense:  30 tablet    Refill:  11   Orders Placed This Encounter  Procedures  . Ambulatory referral to Internal Medicine    Referral Priority:   Routine    Referral Type:   Consultation    Referral Reason:   Specialty Services Required    Requested Specialty:   Internal Medicine    Number of Visits Requested:   1    Brock Bad MD 07-11-2018

## 2018-09-26 ENCOUNTER — Ambulatory Visit (INDEPENDENT_AMBULATORY_CARE_PROVIDER_SITE_OTHER): Payer: BC Managed Care – PPO

## 2018-09-26 ENCOUNTER — Ambulatory Visit: Payer: BC Managed Care – PPO | Admitting: Obstetrics

## 2018-09-26 ENCOUNTER — Other Ambulatory Visit: Payer: Self-pay

## 2018-09-26 DIAGNOSIS — Z3042 Encounter for surveillance of injectable contraceptive: Secondary | ICD-10-CM | POA: Diagnosis not present

## 2018-09-26 NOTE — Progress Notes (Signed)
Nurse visit for Depo Pt is on time for Depo Depo given L Del w/o difficultly Next Depo due Aug 13-27

## 2018-09-30 NOTE — Progress Notes (Signed)
Patient ID: Gina Banks, female   DOB: 25-Feb-1976, 43 y.o.   MRN: [id number] I have reviewed the chart and agree with nursing staff's documentation of this patient's encounter.  Scheryl Darter, MD 09/30/2018 7:23 PM

## 2018-10-03 ENCOUNTER — Other Ambulatory Visit: Payer: Self-pay | Admitting: Obstetrics

## 2018-10-03 ENCOUNTER — Other Ambulatory Visit: Payer: Self-pay

## 2018-10-03 DIAGNOSIS — A6009 Herpesviral infection of other urogenital tract: Secondary | ICD-10-CM

## 2018-10-03 MED ORDER — VALACYCLOVIR HCL 1 G PO TABS: 1000.0000 mg | ORAL_TABLET | Freq: Two times a day (BID) | ORAL | 2 refills | Status: DC

## 2018-10-03 NOTE — Progress Notes (Signed)
Rx sent to patient requested pharmacy.

## 2018-10-22 ENCOUNTER — Ambulatory Visit: Payer: BC Managed Care – PPO | Admitting: Advanced Practice Midwife

## 2018-11-18 ENCOUNTER — Telehealth: Payer: Self-pay

## 2018-11-18 NOTE — Telephone Encounter (Signed)
Returned call, left vm to confirm that we are working on paperwork.

## 2018-11-25 ENCOUNTER — Encounter: Payer: Self-pay | Admitting: Obstetrics

## 2018-11-25 ENCOUNTER — Other Ambulatory Visit: Payer: Self-pay

## 2018-11-25 ENCOUNTER — Ambulatory Visit (INDEPENDENT_AMBULATORY_CARE_PROVIDER_SITE_OTHER): Payer: BC Managed Care – PPO | Admitting: Obstetrics

## 2018-11-25 VITALS — BP 143/106 | HR 103 | Wt 293.8 lb

## 2018-11-25 DIAGNOSIS — Z3042 Encounter for surveillance of injectable contraceptive: Secondary | ICD-10-CM

## 2018-11-25 DIAGNOSIS — I10 Essential (primary) hypertension: Secondary | ICD-10-CM

## 2018-11-25 DIAGNOSIS — Z01419 Encounter for gynecological examination (general) (routine) without abnormal findings: Secondary | ICD-10-CM

## 2018-11-25 DIAGNOSIS — Z1151 Encounter for screening for human papillomavirus (HPV): Secondary | ICD-10-CM

## 2018-11-25 DIAGNOSIS — Z124 Encounter for screening for malignant neoplasm of cervix: Secondary | ICD-10-CM | POA: Diagnosis not present

## 2018-11-25 DIAGNOSIS — Z1239 Encounter for other screening for malignant neoplasm of breast: Secondary | ICD-10-CM

## 2018-11-25 NOTE — Progress Notes (Signed)
Subjective:        Gina Banks is a 43 y.o. female here for a routine exam.  Current complaints: None.    Personal health questionnaire:  Is patient Ashkenazi Jewish, have a family history of breast and/or ovarian cancer: yes, mother is Breast CA Is there a family history of uterine cancer diagnosed at age < 5850, gastrointestinal cancer, urinary tract cancer, family member who is a Personnel officerLynch syndrome-associated carrier: no Is the patient overweight and hypertensive, family history of diabetes, personal history of gestational diabetes, preeclampsia or PCOS: yes Is patient over 6855, have PCOS,  family history of premature CHD under age 44, diabetes, smoke, have hypertension or peripheral artery disease:  yes At any time, has a partner hit, kicked or otherwise hurt or frightened you?: no Over the past 2 weeks, have you felt down, depressed or hopeless?: no Over the past 2 weeks, have you felt little interest or pleasure in doing things?:no   Gynecologic History No LMP recorded. Patient has had an injection. Contraception: Depo-Provera injections Last Pap: 01-18-2017. Results were: normal Last mammogram: 10-24-2016. Results were: normal  Obstetric History OB History  Gravida Para Term Preterm AB Living  5 2 2   3 2   SAB TAB Ectopic Multiple Live Births  2 1   0 2    # Outcome Date GA Lbr Len/2nd Weight Sex Delivery Anes PTL Lv  5 Term 07/03/15 [id number] 00:14 / 00:03 6 lb 0.3 oz (2.73 kg) F Vag-Spont None  LIV  4 SAB 2016     SAB     3 SAB 2015          2 Term 04/13/03 [id number]  8 lb 9 oz (3.884 kg) M Vag-Spont None  LIV  1 TAB 1990        DEC    Past Medical History:  Diagnosis Date  . Cyst of breast   . Gastric bypass status for obesity   . Herpes   . Hx of cholecystectomy   . Hypertension    on meds now    Past Surgical History:  Procedure Laterality Date  . BREAST CYST EXCISION    . BREAST EXCISIONAL BIOPSY Right 1992  . CHOLECYSTECTOMY    . CYST REMOVAL NECK Right    . DILATION AND CURETTAGE OF UTERUS N/A 01/28/2014   Procedure: DILATATION AND CURETTAGE SUCTION WITH CHROMOSOME STUDIES;  Surgeon: Antionette CharLisa Jackson-Moore, MD;  Location: [facility];  Service: Gynecology;  Laterality: N/A;  . Fallopian Tube Cyst    . GASTRIC BYPASS       Current Outpatient Medications:  .  amLODipine (NORVASC) 5 MG tablet, Take 1 tablet (5 mg total) by mouth daily., Disp: 30 tablet, Rfl: 11 .  carvedilol (COREG) 25 MG tablet, Take 1 tablet (25 mg total) by mouth 2 (two) times daily with a meal., Disp: 60 tablet, Rfl: 11 .  medroxyPROGESTERone (DEPO-PROVERA) 150 MG/ML injection, INJECT 1 ML INTO THE MUSCLE EVERY 3 MONTHS., Disp: 1 mL, Rfl: 3 .  valACYclovir (VALTREX) 1000 MG tablet, TAKE 1 TABLET BY MOUTH TWICE A DAY, Disp: 30 tablet, Rfl: 5 .  hydrocortisone (ANUSOL-HC) 25 MG suppository, Place 1 suppository (25 mg total) rectally 2 (two) times daily. (Patient not taking: Reported on 03/08/2016), Disp: 24 suppository, Rfl: PRN .  ibuprofen (ADVIL,MOTRIN) 600 MG tablet, Take 1 tablet (600 mg total) by mouth every 6 (six) hours as needed for mild pain. (Patient not taking: Reported on 03/08/2016), Disp: 30 tablet, Rfl: 5 .  MedroxyPROGESTERone Acetate 150 MG/ML SUSY, INJECT 1 ML INTO THE MUSCLE EVERY 3 MONTHS. (Patient not taking: Reported on 09/26/2018), Disp: 1 Syringe, Rfl: 3 .  nitrofurantoin, macrocrystal-monohydrate, (MACROBID) 100 MG capsule, Take 1 capsule (100 mg total) by mouth 2 (two) times daily. 1 po BID x 7days (Patient not taking: Reported on 09/26/2018), Disp: 14 capsule, Rfl: 0 .  Prenatal Vit-FePoly-FA-DHA (VITAFOL-ONE) 29-1-200 MG CAPS, Take 1 capsule by mouth daily. (Patient not taking: Reported on 03/08/2016), Disp: 30 capsule, Rfl: 11 .  Prenatal-Fe Fum-Methf-FA w/o A (VITAFOL-NANO) 18-0.6-0.4 MG TABS, Take 1 tablet by mouth daily. (Patient not taking: Reported on 03/08/2016), Disp: 30 tablet, Rfl: 11 .  valACYclovir (VALTREX) 1000 MG tablet, Take 1 tablet (1,000 mg total) by  mouth 2 (two) times daily., Disp: 30 tablet, Rfl: 2  Current Facility-Administered Medications:  .  medroxyPROGESTERone (DEPO-PROVERA) injection 150 mg, 150 mg, Intramuscular, Q90 days, Adam PhenixArnold, James G, MD, 150 mg at 09/26/18 1055 No Known Allergies  Social History   Tobacco Use  . Smoking status: Never Smoker  . Smokeless tobacco: Never Used  Substance Use Topics  . Alcohol use: No    Alcohol/week: 0.0 standard drinks    Family History  Problem Relation Age of Onset  . Diabetes Father   . Hypertension Father   . Cancer Mother   . Heart attack Paternal Grandmother       Review of Systems  Constitutional: negative for fatigue and weight loss Respiratory: negative for cough and wheezing Cardiovascular: negative for chest pain, fatigue and palpitations Gastrointestinal: negative for abdominal pain and change in bowel habits Musculoskeletal:negative for myalgias Neurological: negative for gait problems and tremors Behavioral/Psych: negative for abusive relationship, depression Endocrine: negative for temperature intolerance    Genitourinary:negative for abnormal menstrual periods, genital lesions, hot flashes, sexual problems and vaginal discharge Integument/breast: negative for breast lump, breast tenderness, nipple discharge and skin lesion(s)    Objective:       BP (!) 143/106   Pulse (!) 103   Wt 293 lb 12.8 oz (133.3 kg)   Breastfeeding No   BMI 46.02 kg/m  General:   alert  Skin:   no rash or abnormalities  Lungs:   clear to auscultation bilaterally  Heart:   regular rate and rhythm, S1, S2 normal, no murmur, click, rub or gallop  Breasts:   normal without suspicious masses, skin or nipple changes or axillary nodes  Abdomen:  normal findings: no organomegaly, soft, non-tender and no hernia  Pelvis:  External genitalia: normal general appearance Urinary system: urethral meatus normal and bladder without fullness, nontender Vaginal: normal without tenderness,  induration or masses Cervix: normal appearance Adnexa: normal bimanual exam Uterus: anteverted and non-tender, normal size   Lab Review Urine pregnancy test Labs reviewed yes Radiologic studies reviewed yes  50% of 25 min visit spent on counseling and coordination of care.   Assessment:     1. Encounter for gynecological examination with Papanicolaou smear of cervix Rx: - Cytology - PAP( [place])  2. HTN (hypertension), benign - managed by PCP  3. Encounter for surveillance of injectable contraceptive - wants to switch to ParaGuard IUD  4. Class 3 severe obesity due to excess calories without serious comorbidity with body mass index (BMI) of 45.0 to 49.9 in adult (HCC)   5. Screening breast examination Rx: - MM 3D SCREEN BREAST BILATERAL; Future    Plan:    Education reviewed: calcium supplements, depression evaluation, low fat, low cholesterol diet, safe sex/STD prevention,  self breast exams and weight bearing exercise. Contraception: IUD. Mammogram ordered. Follow up in: 1 year.   No orders of the defined types were placed in this encounter.  Orders Placed This Encounter  Procedures  . MM Digital Screening    Standing Status:   Future    Standing Expiration Date:   01/26/2020    Order Specific Question:   Reason for Exam (SYMPTOM  OR DIAGNOSIS REQUIRED)    Answer:   Screening    Order Specific Question:   Is the patient pregnant?    Answer:   No    Order Specific Question:   Preferred imaging location?    Answer:   [facility]    Shelly Bombard MD 11-25-2018

## 2018-11-25 NOTE — Progress Notes (Signed)
Pt is here for annual gyn exam. Depo for contraception. Next depo injection due 8/13-8/27. Last MMG 12/2016. Needs MMG, mom has hx of breast cancer. Last pap smear 10/24/2016, normal.

## 2018-11-28 LAB — CYTOLOGY - PAP
Diagnosis: UNDETERMINED — AB
HPV 16/18/45 genotyping: NEGATIVE
HPV: DETECTED — AB

## 2018-11-29 ENCOUNTER — Telehealth: Payer: Self-pay

## 2018-11-29 NOTE — Telephone Encounter (Signed)
Per Dr.Harper pt can repeat PAP in 1 yr +HPV. HPV  16 18 and 45 were all negative.  Message sent to pt Mychart and I left a message on pt vm.

## 2018-12-09 ENCOUNTER — Other Ambulatory Visit: Payer: Self-pay

## 2018-12-09 ENCOUNTER — Ambulatory Visit: Payer: BC Managed Care – PPO | Admitting: Podiatry

## 2018-12-09 ENCOUNTER — Encounter: Payer: Self-pay | Admitting: Podiatry

## 2018-12-09 VITALS — Temp 97.4°F

## 2018-12-09 DIAGNOSIS — L309 Dermatitis, unspecified: Secondary | ICD-10-CM | POA: Diagnosis not present

## 2018-12-09 NOTE — Progress Notes (Signed)
   Subjective:    Patient ID: Gina Banks, female    DOB: 1976-02-14, 43 y.o.   MRN: [id number]  HPI    Review of Systems  All other systems reviewed and are negative.      Objective:   Physical Exam        Assessment & Plan:

## 2018-12-10 NOTE — Progress Notes (Signed)
Subjective:   Patient ID: Gina Banks, female   DOB: 43 y.o.   MRN: [id number]   HPI Patient presents stating she is concerned about excessive sweating of her feet and states that she feels that at times she has odor but admits she does not wear socks very frequently.  Patient does not smoke likes to be active   Review of Systems  All other systems reviewed and are negative.       Objective:  Physical Exam Vitals signs and nursing note reviewed.  Constitutional:      Appearance: She is well-developed.  Pulmonary:     Effort: Pulmonary effort is normal.  Musculoskeletal: Normal range of motion.  Skin:    General: Skin is warm.  Neurological:     Mental Status: She is alert.     Neurovascular status found to be intact muscle strength was adequate range of motion was within normal limits.  Patient does have moderate excessive sweating but I did not note any breakdown of tissue no indications of significant fungal infection and minimal odor noted     Assessment:  Excessive sweating which is probably due to her overall skin structure with family history of this with minimal indications of odor     Plan:  H&P education rendered considering condition advised on soaks and I do not recommend other pathology or other treatment unless symptoms were to get worse but at this time I think that she should wear socks at all times and I gave her advice on types of socks I think would be best

## 2018-12-13 ENCOUNTER — Ambulatory Visit: Payer: BC Managed Care – PPO

## 2018-12-18 ENCOUNTER — Other Ambulatory Visit: Payer: Self-pay | Admitting: Obstetrics

## 2018-12-19 ENCOUNTER — Ambulatory Visit (INDEPENDENT_AMBULATORY_CARE_PROVIDER_SITE_OTHER): Payer: BC Managed Care – PPO | Admitting: *Deleted

## 2018-12-19 ENCOUNTER — Other Ambulatory Visit: Payer: Self-pay

## 2018-12-19 VITALS — BP 143/95 | HR 85

## 2018-12-19 DIAGNOSIS — Z3042 Encounter for surveillance of injectable contraceptive: Secondary | ICD-10-CM

## 2018-12-19 NOTE — Progress Notes (Signed)
I agree with the nurses note and documentation.   Noni Saupe I, NP 12/19/2018 4:04 PM

## 2018-12-19 NOTE — Progress Notes (Signed)
Pt is in office for depo injection.  Pt is on time for injection. Injection given, pt tolerated well.    Pt advised to RTO 11/5-11/19/2020 for next depo.  Pt has no other concerns today.   Administrations This Visit    medroxyPROGESTERone (DEPO-PROVERA) injection 150 mg    Admin Date 12/19/2018 Action Given Dose 150 mg Route Intramuscular Administered By Valene Bors, CMA

## 2019-01-10 ENCOUNTER — Other Ambulatory Visit: Payer: Self-pay

## 2019-01-10 ENCOUNTER — Ambulatory Visit
Admission: RE | Admit: 2019-01-10 | Discharge: 2019-01-10 | Disposition: A | Discharge status: Home / Self Care | Payer: BC Managed Care – PPO | Source: Ambulatory Visit | Attending: Obstetrics | Admitting: Obstetrics

## 2019-01-10 DIAGNOSIS — Z1239 Encounter for other screening for malignant neoplasm of breast: Secondary | ICD-10-CM

## 2019-03-12 ENCOUNTER — Ambulatory Visit: Payer: BC Managed Care – PPO

## 2019-03-19 ENCOUNTER — Other Ambulatory Visit: Payer: Self-pay

## 2019-03-19 ENCOUNTER — Ambulatory Visit (INDEPENDENT_AMBULATORY_CARE_PROVIDER_SITE_OTHER): Payer: BC Managed Care – PPO

## 2019-03-19 VITALS — BP 142/101 | HR 82 | Ht 67.5 in | Wt 298.5 lb

## 2019-03-19 DIAGNOSIS — Z3042 Encounter for surveillance of injectable contraceptive: Secondary | ICD-10-CM | POA: Diagnosis not present

## 2019-03-19 MED ORDER — MEDROXYPROGESTERONE ACETATE 150 MG/ML IM SUSP
150.0000 mg | Freq: Once | INTRAMUSCULAR | Status: AC
  Administered 2019-03-19: 150 mg via INTRAMUSCULAR

## 2019-03-19 NOTE — Progress Notes (Signed)
Presents for DEPO, injection given in  LD, tolerated well.  Next DEPO Feb. 3-17/2021

## 2019-04-14 ENCOUNTER — Other Ambulatory Visit: Payer: Self-pay | Admitting: Obstetrics

## 2019-04-14 DIAGNOSIS — I10 Essential (primary) hypertension: Secondary | ICD-10-CM

## 2019-04-15 NOTE — Telephone Encounter (Signed)
Refill request from pharmacy for Amlodipine 5 mg   Please send if approved

## 2019-04-28 ENCOUNTER — Ambulatory Visit: Payer: BC Managed Care – PPO | Admitting: Advanced Practice Midwife

## 2019-04-28 ENCOUNTER — Encounter: Payer: Self-pay | Admitting: Advanced Practice Midwife

## 2019-04-28 ENCOUNTER — Other Ambulatory Visit: Payer: Self-pay

## 2019-04-28 VITALS — BP 135/89 | HR 95 | Ht 67.5 in | Wt 298.7 lb

## 2019-04-28 DIAGNOSIS — B9689 Other specified bacterial agents as the cause of diseases classified elsewhere: Secondary | ICD-10-CM | POA: Diagnosis not present

## 2019-04-28 DIAGNOSIS — N760 Acute vaginitis: Secondary | ICD-10-CM

## 2019-04-28 DIAGNOSIS — Z113 Encounter for screening for infections with a predominantly sexual mode of transmission: Secondary | ICD-10-CM | POA: Diagnosis not present

## 2019-04-28 DIAGNOSIS — N898 Other specified noninflammatory disorders of vagina: Secondary | ICD-10-CM

## 2019-04-28 NOTE — Progress Notes (Signed)
  GYNECOLOGY PROGRESS NOTE  History:  43 y.o. [id number] presents to [facility] office today for problem gyn visit. She reports vaginal discharge with fishy odor x 3 days. The discharge was thin and white/clear, but yesterday became more yellow and mucousy.  There is no itching/irritation.  She is not sexually active and denies risks for STDs.  She has no hx of bacterial vaginosis but recently changed laundry detergent and knows that can cause an imbalance/infection sometimes.  She denies h/a, dizziness, shortness of breath, n/v, or fever/chills.    The following portions of the patient's history were reviewed and updated as appropriate: allergies, current medications, past family history, past medical history, past social history, past surgical history and problem list. Last pap smear on 11/25/18 was normal, positive HPV, negative 16, 18, and 45.  Review of Systems:  Pertinent items are noted in HPI.   Objective:  Physical Exam Blood pressure 135/89, pulse 95, height 5' 7.5" (1.715 m), weight 298 lb 11.2 oz (135.5 kg). VS reviewed, nursing note reviewed,  Constitutional: well developed, well nourished, no distress HEENT: normocephalic CV: normal rate Pulm/chest wall: normal effort Breast Exam: deferred Abdomen: soft Neuro: alert and oriented x 3 Skin: warm, dry Psych: affect normal Pelvic exam: Cervix pink, visually closed, without lesion, scant white creamy discharge, vaginal walls and external genitalia normal Bimanual exam: Cervix 0/long/high, firm, anterior, neg CMT, uterus nontender, nonenlarged, adnexa without tenderness, enlargement, or mass  Assessment & Plan:  1. Vaginal discharge --Symptoms c/w BV but with no hx, will wait for test results to treat --Discussed prevention of BV including breathable cotton underwear, decreased use of pantyliners, less frequent washing of vaginal area, condoms during intercourse, and boric acid suppositories.  - Cervicovaginal ancillary only( [facility]) --Repeat Pap in 1 year from abnormal Pap 10/2018  Fatima Blank, CNM 5:13 PM

## 2019-04-28 NOTE — Progress Notes (Signed)
Pt presents for vaginal discharge. Sx started about 2 days ago with fishy odor, and yellowish mucous discharge.

## 2019-04-29 LAB — CERVICOVAGINAL ANCILLARY ONLY
Bacterial Vaginitis (gardnerella): POSITIVE — AB
Candida Glabrata: NEGATIVE
Candida Vaginitis: NEGATIVE
Chlamydia: NEGATIVE
Comment: NEGATIVE
Comment: NEGATIVE
Comment: NEGATIVE
Comment: NEGATIVE
Comment: NEGATIVE
Comment: NORMAL
Neisseria Gonorrhea: NEGATIVE
Trichomonas: NEGATIVE

## 2019-04-30 MED ORDER — METRONIDAZOLE 500 MG PO TABS: 500.0000 mg | ORAL_TABLET | Freq: Two times a day (BID) | ORAL | 0 refills | Status: AC

## 2019-04-30 NOTE — Addendum Note (Signed)
Addended by: Fatima Blank A on: 04/30/2019 09:00 AM   Modules accepted: Orders

## 2019-06-11 ENCOUNTER — Ambulatory Visit: Payer: BC Managed Care – PPO

## 2019-06-16 ENCOUNTER — Ambulatory Visit (INDEPENDENT_AMBULATORY_CARE_PROVIDER_SITE_OTHER): Payer: BC Managed Care – PPO

## 2019-06-16 ENCOUNTER — Other Ambulatory Visit: Payer: Self-pay

## 2019-06-16 DIAGNOSIS — Z3042 Encounter for surveillance of injectable contraceptive: Secondary | ICD-10-CM

## 2019-06-16 NOTE — Progress Notes (Signed)
Patient seen and assessed by nursing staff during this encounter. I have reviewed the chart and agree with the documentation and plan.  Catalina Antigua, MD 06/16/2019 9:24 AM

## 2019-06-16 NOTE — Progress Notes (Signed)
Pt is in the office for depo injection, administered in R arm and pt tolerated well. Next injection due May 3-May 17. Pt states that she will be scheduling appointment to switch to Paraguard IUD. .. Administrations This Visit    medroxyPROGESTERone (DEPO-PROVERA) injection 150 mg    Admin Date 06/16/2019 Action Given Dose 150 mg Route Intramuscular Administered By Katrina Stack, RN

## 2019-08-04 ENCOUNTER — Other Ambulatory Visit: Payer: Self-pay | Admitting: Obstetrics

## 2019-08-04 DIAGNOSIS — I10 Essential (primary) hypertension: Secondary | ICD-10-CM

## 2019-08-29 ENCOUNTER — Encounter: Payer: Self-pay | Admitting: Obstetrics

## 2019-08-29 ENCOUNTER — Ambulatory Visit: Payer: BC Managed Care – PPO | Admitting: Obstetrics

## 2019-08-29 ENCOUNTER — Other Ambulatory Visit: Payer: Self-pay

## 2019-08-29 VITALS — BP 130/93 | HR 85 | Wt 303.0 lb

## 2019-08-29 DIAGNOSIS — Z30430 Encounter for insertion of intrauterine contraceptive device: Secondary | ICD-10-CM | POA: Diagnosis not present

## 2019-08-29 LAB — POCT URINE PREGNANCY: Preg Test, Ur: NEGATIVE

## 2019-08-29 MED ORDER — LEVONORGESTREL 20 MCG/24HR IU IUD
INTRAUTERINE_SYSTEM | Freq: Once | INTRAUTERINE | Status: AC
  Administered 2019-08-29: 1 via INTRAUTERINE

## 2019-08-29 NOTE — Progress Notes (Signed)
Gina Brock Bad, MD 08/29/2019 11:41 AM

## 2019-08-29 NOTE — Progress Notes (Signed)
Pt presents for IUD Insertion pt would like to have Mirena instead of Paragard.   LMP: 2016   Pt denies  any unprotected Intercourse at this time pt is not active.

## 2019-08-29 NOTE — Progress Notes (Signed)
IUD Insertion Procedure Note  Pre-operative Diagnosis: Desire Contraception  Post-operative Diagnosis: same  Indications: contraception  Procedure Details  Urine pregnancy test was done in office and result was negative.  The risks (including infection, bleeding, pain, and uterine perforation) and benefits of the procedure were explained to the patient and Written informed consent was obtained.    Cervix cleansed with Betadine. Uterus sounded to 8 cm. IUD inserted without difficulty. String visible and trimmed. Patient tolerated procedure well.  IUD Information: Mirena                                                                              Lot # [id number] Expiration date AUG 2023.  Condition: Stable  Complications: None  Plan:  The patient was advised to call for any fever or for prolonged or severe pain or bleeding. She was advised to use NSAID as needed for mild to moderate pain.   Attending Physician Documentation: I was present for or participated in the entire procedure, including opening and closing.  Brock Bad, MD 08/29/2019 11:03 AM

## 2019-09-15 ENCOUNTER — Other Ambulatory Visit: Payer: Self-pay | Admitting: Obstetrics

## 2019-10-10 ENCOUNTER — Ambulatory Visit: Payer: BC Managed Care – PPO | Admitting: Obstetrics

## 2019-10-13 ENCOUNTER — Encounter: Payer: Self-pay | Admitting: Obstetrics

## 2019-10-13 ENCOUNTER — Ambulatory Visit: Payer: BC Managed Care – PPO | Admitting: Obstetrics

## 2019-10-13 ENCOUNTER — Other Ambulatory Visit: Payer: Self-pay

## 2019-10-13 VITALS — BP 127/86 | HR 90 | Wt 302.0 lb

## 2019-10-13 DIAGNOSIS — I10 Essential (primary) hypertension: Secondary | ICD-10-CM | POA: Diagnosis not present

## 2019-10-13 DIAGNOSIS — Z30431 Encounter for routine checking of intrauterine contraceptive device: Secondary | ICD-10-CM | POA: Diagnosis not present

## 2019-10-13 DIAGNOSIS — Z6841 Body Mass Index (BMI) 40.0 and over, adult: Secondary | ICD-10-CM | POA: Diagnosis not present

## 2019-10-13 NOTE — Progress Notes (Signed)
RGYN patient presents for IUD Check.  CC: None

## 2019-10-13 NOTE — Progress Notes (Signed)
Subjective:    Gina Banks is a 44 y.o. female who presents for IUD string check after insertion. The patient has no complaints today. The patient is sexually active. Pertinent past medical history: hypertension.  The information documented in the HPI was reviewed and verified.  Menstrual History: OB History    Gravida  5   Para  2   Term  2   Preterm      AB  3   Living  2     SAB  2   TAB  1   Ectopic      Multiple  0   Live Births  2            No LMP recorded. Patient has had an injection.   Patient Active Problem List   Diagnosis Date Noted  . Obesity, Class III, BMI 40-49.9 (morbid obesity) (HCC) 04/28/2019  . NSVD (normal spontaneous vaginal delivery) 07/03/2015  . Hereditary disease in family possibly affecting fetus, affecting management of mother, antepartum condition or complication   . Superficial fungus infection of skin 03/16/2014  . Missed abortion 01/24/2014  . Unspecified vitamin D deficiency 06/07/2013   Past Medical History:  Diagnosis Date  . Cyst of breast   . Gastric bypass status for obesity   . Herpes   . Hx of cholecystectomy   . Hypertension    on meds now    Past Surgical History:  Procedure Laterality Date  . BREAST CYST EXCISION    . BREAST EXCISIONAL BIOPSY Right 1992  . CHOLECYSTECTOMY    . CYST REMOVAL NECK Right   . DILATION AND CURETTAGE OF UTERUS N/A 01/28/2014   Procedure: DILATATION AND CURETTAGE SUCTION WITH CHROMOSOME STUDIES;  Surgeon: Antionette Char, MD;  Location: [facility];  Service: Gynecology;  Laterality: N/A;  . Fallopian Tube Cyst    . GASTRIC BYPASS       Current Outpatient Medications:  .  amLODipine (NORVASC) 5 MG tablet, TAKE 1 TABLET BY MOUTH EVERY DAY, Disp: 90 tablet, Rfl: 3 .  carvedilol (COREG) 25 MG tablet, TAKE 1 TABLET (25 MG TOTAL) BY MOUTH 2 (TWO) TIMES DAILY WITH A MEAL., Disp: 60 tablet, Rfl: 11 .  ibuprofen (ADVIL) 800 MG tablet, TAKE 1 TABLET BY MOUTH THREE TIMES A  DAY AS NEEDED, Disp: 30 tablet, Rfl: 5 .  ibuprofen (ADVIL,MOTRIN) 600 MG tablet, Take 1 tablet (600 mg total) by mouth every 6 (six) hours as needed for mild pain., Disp: 30 tablet, Rfl: 5 .  levonorgestrel (MIRENA) 20 MCG/24HR IUD, 1 each by Intrauterine route once., Disp: , Rfl:  .  Multiple Vitamin (MULTIVITAMIN PO), Take 1 tablet by mouth daily., Disp: , Rfl:  .  valACYclovir (VALTREX) 1000 MG tablet, TAKE 1 TABLET BY MOUTH TWICE A DAY, Disp: 30 tablet, Rfl: 5 .  medroxyPROGESTERone (DEPO-PROVERA) 150 MG/ML injection, INJECT 1 ML INTO THE MUSCLE EVERY 3 MONTHS. (Patient not taking: Reported on 10/13/2019), Disp: 1 mL, Rfl: 3 .  MedroxyPROGESTERone Acetate 150 MG/ML SUSY, INJECT 1 ML INTO THE MUSCLE EVERY 3 MONTHS. (Patient not taking: Reported on 10/13/2019), Disp: 1 Syringe, Rfl: 3  Current Facility-Administered Medications:  .  medroxyPROGESTERone (DEPO-PROVERA) injection 150 mg, 150 mg, Intramuscular, Q90 days, Adam Phenix, MD, 150 mg at 06/16/19 0850 No Known Allergies  Social History   Tobacco Use  . Smoking status: Never Smoker  . Smokeless tobacco: Never Used  Substance Use Topics  . Alcohol use: No    Alcohol/week: 0.0  standard drinks    Family History  Problem Relation Age of Onset  . Diabetes Father   . Hypertension Father   . Cancer Mother   . Heart attack Paternal Grandmother        Review of Systems Constitutional: negative for weight loss Genitourinary:negative for abnormal menstrual periods and vaginal discharge   Objective:   BP 127/86   Pulse 90   Wt (!) 302 lb (137 kg)   BMI 46.60 kg/m           General: Alert and no distress Abdomen:  normal findings: no organomegaly, soft, non-tender and no hernia  Pelvis:  External genitalia: normal general appearance Urinary system: urethral meatus normal and bladder without fullness, nontender Vaginal: normal without tenderness, induration or masses Cervix: normal appearance Adnexa: normal bimanual  exam Uterus: anteverted and non-tender, normal size   Lab Review Urine pregnancy test Labs reviewed yes Radiologic studies reviewed yes  50% of 15 min visit spent on counseling and coordination of care.    Assessment:    44 y.o., continuing IUD, no contraindications.    1. IUD check up  2. Class 3 severe obesity due to excess calories without serious comorbidity with body mass index (BMI) of 45.0 to 49.9 in adult [facility]) - program of caloric reduction, exercise and behavioral modification recommended  3. HTN (hypertension), benign - clinically stable.  Managed by PCP.  Plan:    All questions answered. Contraception: IUD. Discussed healthy lifestyle modifications. Follow up in 3 months.  Annual.   Shelly Bombard, MD 10/13/2019 2:27 PM

## 2019-11-27 ENCOUNTER — Ambulatory Visit: Payer: BC Managed Care – PPO | Admitting: Obstetrics

## 2019-12-08 ENCOUNTER — Ambulatory Visit (INDEPENDENT_AMBULATORY_CARE_PROVIDER_SITE_OTHER): Payer: BC Managed Care – PPO | Admitting: Obstetrics

## 2019-12-08 ENCOUNTER — Other Ambulatory Visit (HOSPITAL_COMMUNITY)
Admission: RE | Admit: 2019-12-08 | Discharge: 2019-12-08 | Disposition: A | Discharge status: Home / Self Care | Payer: BC Managed Care – PPO | Source: Ambulatory Visit | Attending: Obstetrics | Admitting: Obstetrics

## 2019-12-08 ENCOUNTER — Encounter: Payer: Self-pay | Admitting: Obstetrics

## 2019-12-08 ENCOUNTER — Other Ambulatory Visit: Payer: Self-pay

## 2019-12-08 VITALS — BP 126/84 | HR 84 | Wt 303.4 lb

## 2019-12-08 DIAGNOSIS — N898 Other specified noninflammatory disorders of vagina: Secondary | ICD-10-CM | POA: Insufficient documentation

## 2019-12-08 DIAGNOSIS — Z113 Encounter for screening for infections with a predominantly sexual mode of transmission: Secondary | ICD-10-CM

## 2019-12-08 DIAGNOSIS — B9689 Other specified bacterial agents as the cause of diseases classified elsewhere: Secondary | ICD-10-CM

## 2019-12-08 DIAGNOSIS — Z01411 Encounter for gynecological examination (general) (routine) with abnormal findings: Secondary | ICD-10-CM

## 2019-12-08 DIAGNOSIS — Z975 Presence of (intrauterine) contraceptive device: Secondary | ICD-10-CM

## 2019-12-08 DIAGNOSIS — I10 Essential (primary) hypertension: Secondary | ICD-10-CM | POA: Diagnosis not present

## 2019-12-08 DIAGNOSIS — Z0000 Encounter for general adult medical examination without abnormal findings: Secondary | ICD-10-CM

## 2019-12-08 DIAGNOSIS — Z6841 Body Mass Index (BMI) 40.0 and over, adult: Secondary | ICD-10-CM

## 2019-12-08 DIAGNOSIS — Z30431 Encounter for routine checking of intrauterine contraceptive device: Secondary | ICD-10-CM

## 2019-12-08 DIAGNOSIS — Z01419 Encounter for gynecological examination (general) (routine) without abnormal findings: Secondary | ICD-10-CM | POA: Diagnosis not present

## 2019-12-08 DIAGNOSIS — N760 Acute vaginitis: Secondary | ICD-10-CM

## 2019-12-08 DIAGNOSIS — R87810 Cervical high risk human papillomavirus (HPV) DNA test positive: Secondary | ICD-10-CM

## 2019-12-08 MED ORDER — AMLODIPINE BESYLATE 5 MG PO TABS: 5.0000 mg | ORAL_TABLET | Freq: Every day | ORAL | 3 refills | Status: DC

## 2019-12-08 MED ORDER — CARVEDILOL 25 MG PO TABS: 25.0000 mg | ORAL_TABLET | Freq: Two times a day (BID) | ORAL | 11 refills | Status: DC

## 2019-12-08 NOTE — Progress Notes (Signed)
Subjective:        Gina Banks is a 44 y.o. female here for a routine exam.  Current complaints: Vaginal discharge with irritation.    Personal health questionnaire:  Is patient Ashkenazi Jewish, have a family history of breast and/or ovarian cancer: no Is there a family history of uterine cancer diagnosed at age < 37, gastrointestinal cancer, urinary tract cancer, family member who is a Personnel officer syndrome-associated carrier: no Is the patient overweight and hypertensive, family history of diabetes, personal history of gestational diabetes, preeclampsia or PCOS: no Is patient over 62, have PCOS,  family history of premature CHD under age 52, diabetes, smoke, have hypertension or peripheral artery disease:  no At any time, has a partner hit, kicked or otherwise hurt or frightened you?: no Over the past 2 weeks, have you felt down, depressed or hopeless?: no Over the past 2 weeks, have you felt little interest or pleasure in doing things?:no   Gynecologic History Patient's last menstrual period was 11/07/2019. Contraception: IUD Last Pap: 11-25-2018. Results were: ASCUS with positive High Risk HPV Last mammogram: 12-2018. Results were: normal  Obstetric History OB History  Gravida Para Term Preterm AB Living  5 2 2   3 2   SAB TAB Ectopic Multiple Live Births  2 1   0 2    # Outcome Date GA Lbr Len/2nd Weight Sex Delivery Anes PTL Lv  5 Term 07/03/15 [redacted]w[redacted]d 00:14 / 00:03 6 lb 0.3 oz (2.73 kg) F Vag-Spont None  LIV  4 SAB 2016     SAB     3 SAB 2015          2 Term 04/13/03 [redacted]w[redacted]d  8 lb 9 oz (3.884 kg) M Vag-Spont None  LIV  1 TAB 1990        DEC    Past Medical History:  Diagnosis Date  . Cyst of breast   . Gastric bypass status for obesity   . Herpes   . Hx of cholecystectomy   . Hypertension    on meds now    Past Surgical History:  Procedure Laterality Date  . BREAST CYST EXCISION    . BREAST EXCISIONAL BIOPSY Right 1992  . CHOLECYSTECTOMY    . CYST  REMOVAL NECK Right   . DILATION AND CURETTAGE OF UTERUS N/A 01/28/2014   Procedure: DILATATION AND CURETTAGE SUCTION WITH CHROMOSOME STUDIES;  Surgeon: 01/30/2014, MD;  Location: [facility];  Service: Gynecology;  Laterality: N/A;  . Fallopian Tube Cyst    . GASTRIC BYPASS       Current Outpatient Medications:  .  amLODipine (NORVASC) 5 MG tablet, Take 1 tablet (5 mg total) by mouth daily., Disp: 90 tablet, Rfl: 3 .  carvedilol (COREG) 25 MG tablet, Take 1 tablet (25 mg total) by mouth 2 (two) times daily with a meal., Disp: 60 tablet, Rfl: 11 .  levonorgestrel (MIRENA) 20 MCG/24HR IUD, 1 each by Intrauterine route once., Disp: , Rfl:  .  Multiple Vitamin (MULTIVITAMIN PO), Take 1 tablet by mouth daily., Disp: , Rfl:  .  ibuprofen (ADVIL) 800 MG tablet, TAKE 1 TABLET BY MOUTH THREE TIMES A DAY AS NEEDED (Patient not taking: Reported on 12/08/2019), Disp: 30 tablet, Rfl: 5 .  ibuprofen (ADVIL,MOTRIN) 600 MG tablet, Take 1 tablet (600 mg total) by mouth every 6 (six) hours as needed for mild pain. (Patient not taking: Reported on 12/08/2019), Disp: 30 tablet, Rfl: 5 .  medroxyPROGESTERone (DEPO-PROVERA) 150 MG/ML injection,  INJECT 1 ML INTO THE MUSCLE EVERY 3 MONTHS. (Patient not taking: Reported on 10/13/2019), Disp: 1 mL, Rfl: 3 .  MedroxyPROGESTERone Acetate 150 MG/ML SUSY, INJECT 1 ML INTO THE MUSCLE EVERY 3 MONTHS. (Patient not taking: Reported on 10/13/2019), Disp: 1 Syringe, Rfl: 3 .  valACYclovir (VALTREX) 1000 MG tablet, TAKE 1 TABLET BY MOUTH TWICE A DAY (Patient not taking: Reported on 12/08/2019), Disp: 30 tablet, Rfl: 5  Current Facility-Administered Medications:  .  medroxyPROGESTERone (DEPO-PROVERA) injection 150 mg, 150 mg, Intramuscular, Q90 days, Adam Phenix, MD, 150 mg at 06/16/19 0850 No Known Allergies  Social History   Tobacco Use  . Smoking status: Never Smoker  . Smokeless tobacco: Never Used  Substance Use Topics  . Alcohol use: No    Alcohol/week: 0.0 standard  drinks    Family History  Problem Relation Age of Onset  . Diabetes Father   . Hypertension Father   . Cancer Mother   . Heart attack Paternal Grandmother       Review of Systems  Constitutional: negative for fatigue and weight loss Respiratory: negative for cough and wheezing Cardiovascular: negative for chest pain, fatigue and palpitations Gastrointestinal: negative for abdominal pain and change in bowel habits Musculoskeletal:negative for myalgias Neurological: negative for gait problems and tremors Behavioral/Psych: negative for abusive relationship, depression Endocrine: negative for temperature intolerance    Genitourinary:negative for abnormal menstrual periods, genital lesions, hot flashes, sexual problems.  Positive for vaginal discharge Integument/breast: negative for breast lump, breast tenderness, nipple discharge and skin lesion(s)    Objective:       BP 126/84   Pulse 84   Wt (!) 303 lb 6.4 oz (137.6 kg)   LMP 11/07/2019   BMI 46.82 kg/m  General:   alert and no distress  Skin:   no rash or abnormalities  Lungs:   clear to auscultation bilaterally  Heart:   regular rate and rhythm, S1, S2 normal, no murmur, click, rub or gallop  Breasts:   normal without suspicious masses, skin or nipple changes or axillary nodes  Abdomen:  normal findings: no organomegaly, soft, non-tender and no hernia  Pelvis:  External genitalia: normal general appearance Urinary system: urethral meatus normal and bladder without fullness, nontender Vaginal: normal without tenderness, induration or masses Cervix: normal appearance Adnexa: normal bimanual exam Uterus: anteverted and non-tender, normal size   Lab Review Urine pregnancy test Labs reviewed yes Radiologic studies reviewed no  50% of 20 min visit spent on counseling and coordination of care.   Assessment:     1. Encounter for routine gynecological examination with Papanicolaou smear of cervix Rx: - Cytology - PAP(  [place])  2. Vaginal discharge Rx: - Cervicovaginal ancillary only( [place])  3. Screen for STD (sexually transmitted disease) Rx: - Hepatitis B surface antigen - Hepatitis C antibody - HIV Antibody (routine testing w rflx) - RPR  4. IUD check up - pleased with Mirena IUD  5. Routine adult health maintenance Rx: - TSH - Comprehensive metabolic panel - Hemoglobin A1c - Ferritin - CBC  6. HTN (hypertension), benign Rx: - amLODipine (NORVASC) 5 MG tablet; Take 1 tablet (5 mg total) by mouth daily.  Dispense: 90 tablet; Refill: 3 - carvedilol (COREG) 25 MG tablet; Take 1 tablet (25 mg total) by mouth 2 (two) times daily with a meal.  Dispense: 60 tablet; Refill: 11  7. Class 3 severe obesity due to excess calories without serious comorbidity with body mass index (BMI) of 45.0 to  49.9 in adult (HCC) - weight loss with the aid of diet, exercise and behavioral modification recommended    Plan:     Follow up in 12 months   Meds ordered this encounter  Medications  . amLODipine (NORVASC) 5 MG tablet    Sig: Take 1 tablet (5 mg total) by mouth daily.    Dispense:  90 tablet    Refill:  3    DX Code Needed  .  . carvedilol (COREG) 25 MG tablet    Sig: Take 1 tablet (25 mg total) by mouth 2 (two) times daily with a meal.    Dispense:  60 tablet    Refill:  11   Orders Placed This Encounter  Procedures  . Hepatitis B surface antigen  . Hepatitis C antibody  . HIV Antibody (routine testing w rflx)  . RPR  . TSH  . Comprehensive metabolic panel  . Hemoglobin A1c  . Ferritin  . CBC    Brock Bad, MD 12/08/2019 4:52 PM

## 2019-12-08 NOTE — Progress Notes (Signed)
Pt presents for annual with all STD testing ASCUS pap with HR HPV 11/25/2018

## 2019-12-09 ENCOUNTER — Other Ambulatory Visit: Payer: Self-pay | Admitting: Obstetrics

## 2019-12-09 LAB — CERVICOVAGINAL ANCILLARY ONLY
Bacterial Vaginitis (gardnerella): POSITIVE — AB
Candida Glabrata: NEGATIVE
Candida Vaginitis: NEGATIVE
Chlamydia: NEGATIVE
Comment: NEGATIVE
Comment: NEGATIVE
Comment: NEGATIVE
Comment: NEGATIVE
Comment: NEGATIVE
Comment: NORMAL
Neisseria Gonorrhea: NEGATIVE
Trichomonas: NEGATIVE

## 2019-12-09 LAB — COMPREHENSIVE METABOLIC PANEL
ALT: 24 IU/L (ref 0–32)
AST: 24 IU/L (ref 0–40)
Albumin/Globulin Ratio: 1.3 (ref 1.2–2.2)
Albumin: 4 g/dL (ref 3.8–4.8)
Alkaline Phosphatase: 93 IU/L (ref 48–121)
BUN/Creatinine Ratio: 9 (ref 9–23)
BUN: 7 mg/dL (ref 6–24)
Bilirubin Total: 0.3 mg/dL (ref 0.0–1.2)
CO2: 25 mmol/L (ref 20–29)
Calcium: 9.2 mg/dL (ref 8.7–10.2)
Chloride: 104 mmol/L (ref 96–106)
Creatinine, Ser: 0.79 mg/dL (ref 0.57–1.00)
GFR calc Af Amer: 105 mL/min/{1.73_m2} (ref >59)
GFR calc non Af Amer: 91 mL/min/{1.73_m2} (ref >59)
Globulin, Total: 3.1 g/dL (ref 1.5–4.5)
Glucose: 87 mg/dL (ref 65–99)
Potassium: 4.4 mmol/L (ref 3.5–5.2)
Sodium: 140 mmol/L (ref 134–144)
Total Protein: 7.1 g/dL (ref 6.0–8.5)

## 2019-12-09 LAB — HEPATITIS B SURFACE ANTIGEN: Hepatitis B Surface Ag: NEGATIVE

## 2019-12-09 LAB — HEMOGLOBIN A1C
Est. average glucose Bld gHb Est-mCnc: 117 mg/dL
Hgb A1c MFr Bld: 5.7 % — ABNORMAL HIGH (ref 4.8–5.6)

## 2019-12-09 LAB — FERRITIN: Ferritin: 28 ng/mL (ref 15–150)

## 2019-12-09 LAB — CBC
Hematocrit: 34.3 % (ref 34.0–46.6)
Hemoglobin: 10.5 g/dL — ABNORMAL LOW (ref 11.1–15.9)
MCH: 26.2 pg — ABNORMAL LOW (ref 26.6–33.0)
MCHC: 30.6 g/dL — ABNORMAL LOW (ref 31.5–35.7)
MCV: 86 fL (ref 79–97)
Platelets: 270 10*3/uL (ref 150–450)
RBC: 4.01 x10E6/uL (ref 3.77–5.28)
RDW: 13.1 % (ref 11.7–15.4)
WBC: 7.2 10*3/uL (ref 3.4–10.8)

## 2019-12-09 LAB — HIV ANTIBODY (ROUTINE TESTING W REFLEX): HIV Screen 4th Generation wRfx: NONREACTIVE

## 2019-12-09 LAB — HEPATITIS C ANTIBODY: Hep C Virus Ab: <0.1 s/co ratio (ref 0.0–0.9)

## 2019-12-09 LAB — TSH: TSH: 2.04 u[IU]/mL (ref 0.450–4.500)

## 2019-12-09 LAB — RPR: RPR Ser Ql: NONREACTIVE

## 2019-12-12 LAB — CERVICOVAGINAL ANCILLARY ONLY: HPV 16/18/45 genotyping: NEGATIVE

## 2019-12-15 LAB — CYTOLOGY - PAP
Comment: NEGATIVE
Comment: NEGATIVE
Diagnosis: NEGATIVE
HPV 16: NEGATIVE
HPV 18 / 45: NEGATIVE
High risk HPV: POSITIVE — AB

## 2019-12-16 ENCOUNTER — Telehealth: Payer: Self-pay

## 2019-12-16 NOTE — Telephone Encounter (Signed)
Called to advise of results and need for colpo, no answer, left vm °

## 2020-05-01 ENCOUNTER — Other Ambulatory Visit: Payer: Self-pay | Admitting: Obstetrics

## 2020-05-01 DIAGNOSIS — A6009 Herpesviral infection of other urogenital tract: Secondary | ICD-10-CM

## 2020-05-06 ENCOUNTER — Encounter: Payer: Self-pay | Admitting: Podiatry

## 2020-05-06 ENCOUNTER — Other Ambulatory Visit: Payer: Self-pay

## 2020-05-06 ENCOUNTER — Ambulatory Visit (INDEPENDENT_AMBULATORY_CARE_PROVIDER_SITE_OTHER): Payer: Self-pay

## 2020-05-06 ENCOUNTER — Ambulatory Visit (INDEPENDENT_AMBULATORY_CARE_PROVIDER_SITE_OTHER): Payer: Self-pay | Admitting: Podiatry

## 2020-05-06 DIAGNOSIS — M722 Plantar fascial fibromatosis: Secondary | ICD-10-CM

## 2020-05-06 DIAGNOSIS — M79673 Pain in unspecified foot: Secondary | ICD-10-CM

## 2020-05-06 MED ORDER — DICLOFENAC SODIUM 75 MG PO TBEC
75.0000 mg | DELAYED_RELEASE_TABLET | Freq: Two times a day (BID) | ORAL | 2 refills | Status: DC
Start: 2020-05-06 — End: 2023-07-03

## 2020-05-06 NOTE — Progress Notes (Signed)
Subjective:   Patient ID: Gina Banks, female   DOB: 45 y.o.   MRN: [id number]   HPI Patient presents stating she has had a lot of pain in the bottom of her right heel for the last few months and it intensified over the last few weeks.  States that she does not remember specific injury   ROS      Objective:  Physical Exam  Neurovascular status intact with patient found to have exquisite discomfort plantar aspect right heel at the insertional point of the tendon into the calcaneus with inflammation fluid of the medial band     Assessment:  Acute plantar fasciitis right with inflammation fluid buildup     Plan:  H&P reviewed condition sterile prep done injected the plantar fascia right 3 mg Kenalog 5 mg Xylocaine applied fascial brace instructed on oral anti-inflammatories and shoe gear modification and reappoint to recheck  X-ray indicated small spur negative for signs of stress fracture or advanced arthritis

## 2020-05-06 NOTE — Patient Instructions (Signed)

## 2020-05-14 ENCOUNTER — Other Ambulatory Visit: Payer: Self-pay | Admitting: Podiatry

## 2020-05-14 DIAGNOSIS — M722 Plantar fascial fibromatosis: Secondary | ICD-10-CM

## 2020-05-17 ENCOUNTER — Ambulatory Visit: Payer: Self-pay | Admitting: Podiatry

## 2020-06-07 ENCOUNTER — Ambulatory Visit: Payer: BC Managed Care – PPO | Admitting: Podiatry

## 2020-06-07 ENCOUNTER — Encounter: Payer: Self-pay | Admitting: Podiatry

## 2020-06-07 ENCOUNTER — Other Ambulatory Visit: Payer: Self-pay

## 2020-06-07 DIAGNOSIS — B070 Plantar wart: Secondary | ICD-10-CM | POA: Diagnosis not present

## 2020-06-08 NOTE — Progress Notes (Signed)
Subjective:   Patient ID: Gina Banks, female   DOB: 45 y.o.   MRN: [id number]   HPI Patient states the pain has come back quite a bit over the last month and she feels like she is walking on a small palpable   ROS      Objective:  Physical Exam  Neurovascular status intact with pain in the plantar heel right but upon debridement shows a small lesion measuring approximately 5 x 5 mm that appears to be verrucous in its origin      Assessment:  Probability for verruca plantar right     Plan:  H&P condition discussed sterile debridement accomplished no iatrogenic bleeding applied chemical agent to create immune response with sterile dressing explained what to do if blistering were to occur reappoint to recheck

## 2020-07-05 ENCOUNTER — Ambulatory Visit: Payer: BC Managed Care – PPO | Admitting: Podiatry

## 2020-07-05 ENCOUNTER — Other Ambulatory Visit: Payer: Self-pay

## 2020-07-05 DIAGNOSIS — B070 Plantar wart: Secondary | ICD-10-CM

## 2020-07-05 DIAGNOSIS — M722 Plantar fascial fibromatosis: Secondary | ICD-10-CM | POA: Diagnosis not present

## 2020-07-05 MED ORDER — DICLOFENAC SODIUM 75 MG PO TBEC: 75.0000 mg | DELAYED_RELEASE_TABLET | Freq: Two times a day (BID) | ORAL | 2 refills | Status: DC

## 2020-07-05 MED ORDER — TRIAMCINOLONE ACETONIDE 10 MG/ML IJ SUSP
10.0000 mg | Freq: Once | INTRAMUSCULAR | Status: AC
  Administered 2020-07-05: 10 mg

## 2020-07-10 NOTE — Progress Notes (Addendum)
Subjective:   Patient ID: Gina Banks, female   DOB: 45 y.o.   MRN: [id number]   HPI Patient states that for the most part what she is experiencing has pain in the plantar heel but overall she is doing pretty well but still having some discomfort with prolonged ambulation   ROS      Objective:  Physical Exam  Neurovascular status intact currently lesion is doing well with minimal discomfort with continued discomfort in the plantar aspect of the right heel     Assessment:  Plantar fasciitis right still present     Plan:  H&P reviewed condition sterile prep injected the fascia 3 mg Kenalog 5 mg Xylocaine and advised on the continuation of supportive therapy anti-inflammatories.  Patient will be seen back to recheck as needed      Patient was assessed and managed by nursing staff during this encounter. I have reviewed the chart and agree with the documentation and plan. I have also made any necessary editorial changes.  Coral Ceo, MD 07/13/2020 8:24 AM

## 2020-07-24 ENCOUNTER — Other Ambulatory Visit: Payer: Self-pay | Admitting: Obstetrics

## 2020-07-24 DIAGNOSIS — A6009 Herpesviral infection of other urogenital tract: Secondary | ICD-10-CM

## 2020-11-10 ENCOUNTER — Other Ambulatory Visit: Payer: Self-pay

## 2020-11-10 ENCOUNTER — Ambulatory Visit: Payer: BC Managed Care – PPO | Admitting: Podiatry

## 2020-11-10 DIAGNOSIS — M722 Plantar fascial fibromatosis: Secondary | ICD-10-CM

## 2020-11-10 MED ORDER — TRIAMCINOLONE ACETONIDE 10 MG/ML IJ SUSP
10.0000 mg | Freq: Once | INTRAMUSCULAR | Status: AC
  Administered 2020-11-10: 10 mg

## 2020-11-10 MED ORDER — MELOXICAM 15 MG PO TABS: 15.0000 mg | ORAL_TABLET | Freq: Every day | ORAL | 2 refills | Status: DC

## 2020-11-11 NOTE — Progress Notes (Signed)
Subjective:   Patient ID: Gina Banks, female   DOB: 45 y.o.   MRN: [id number]   HPI Patient states she was doing well but she is just had over the last few weeks concerned about discomfort in the plantar heel right    ROS      Objective:  Physical Exam  Neurovascular status intact with return of exquisite discomfort plantar aspect right heel insertional point tendon     Assessment:  Plantar fasciitis right inflamed     Plan:  Sterile prep injected the plantar fascial right 3 mg Kenalog 5 mg Xylocaine advised on anti-inflammatories reappoint as needed

## 2020-12-13 ENCOUNTER — Ambulatory Visit: Payer: BC Managed Care – PPO | Admitting: Podiatry

## 2021-02-13 ENCOUNTER — Other Ambulatory Visit: Payer: Self-pay | Admitting: Obstetrics

## 2021-02-13 DIAGNOSIS — I10 Essential (primary) hypertension: Secondary | ICD-10-CM

## 2021-03-02 ENCOUNTER — Other Ambulatory Visit: Payer: Self-pay | Admitting: Obstetrics

## 2021-03-02 DIAGNOSIS — I10 Essential (primary) hypertension: Secondary | ICD-10-CM

## 2021-10-20 ENCOUNTER — Other Ambulatory Visit: Payer: BC Managed Care – PPO

## 2021-12-30 ENCOUNTER — Ambulatory Visit (INDEPENDENT_AMBULATORY_CARE_PROVIDER_SITE_OTHER): Payer: BC Managed Care – PPO | Admitting: Family Medicine

## 2021-12-30 ENCOUNTER — Other Ambulatory Visit (HOSPITAL_COMMUNITY)
Admission: RE | Admit: 2021-12-30 | Discharge: 2021-12-30 | Disposition: A | Discharge status: Home / Self Care | Payer: BC Managed Care – PPO | Source: Ambulatory Visit | Attending: Family Medicine | Admitting: Family Medicine

## 2021-12-30 ENCOUNTER — Encounter: Payer: Self-pay | Admitting: Family Medicine

## 2021-12-30 VITALS — BP 123/88 | HR 84 | Ht 67.5 in | Wt 277.2 lb

## 2021-12-30 DIAGNOSIS — N760 Acute vaginitis: Secondary | ICD-10-CM | POA: Diagnosis not present

## 2021-12-30 DIAGNOSIS — Z01419 Encounter for gynecological examination (general) (routine) without abnormal findings: Secondary | ICD-10-CM | POA: Diagnosis present

## 2021-12-30 DIAGNOSIS — A6009 Herpesviral infection of other urogenital tract: Secondary | ICD-10-CM

## 2021-12-30 DIAGNOSIS — B9689 Other specified bacterial agents as the cause of diseases classified elsewhere: Secondary | ICD-10-CM

## 2021-12-30 DIAGNOSIS — Z124 Encounter for screening for malignant neoplasm of cervix: Secondary | ICD-10-CM

## 2021-12-30 DIAGNOSIS — Z1211 Encounter for screening for malignant neoplasm of colon: Secondary | ICD-10-CM

## 2021-12-30 MED ORDER — VALACYCLOVIR HCL 1 G PO TABS: 1000.0000 mg | ORAL_TABLET | Freq: Two times a day (BID) | ORAL | 2 refills | Status: DC

## 2021-12-30 NOTE — Progress Notes (Signed)
Pt presents for AEX. Pt declines STD testing. Pt c/o vaginal dryness and odor.

## 2021-12-30 NOTE — Patient Instructions (Signed)
Natural Remedies for Bacterial Vaginosis ° °Option #1 °1 Tbsp Fractitionated Coconut Oil °10 drops of Melaleuca (Tea Tree) Oil ° °Mix ingredients together well.  Soak 3-4 tampons (in applicators) in that mixture until all or mostly all mixture is soaked up into the tampons.  Insert 1 saturated tampon vaginally and wear overnight for 3-4 nights.   ° °Option #2 (sometimes to be used in conjunction with option #1) °Fill tub with enough to cover lap/lower abdomen warm water.  Mix 1/2 cup of baking soda in water.  Soak in water/baking soda mixture for at least 20 minutes.  Be sure to swish water in between legs to get as much in vagina as possible.  This soak should be done after sexual intercourse and menstrual cycles.   ° ° °Option #3 (sometimes to be used in conjunction with option #1 and 2) °Fill tub with enough to cover lap/lower abdomen warm water.  Mix 2-4 cup of apple cider vinegar in water.  Soak in water/vinegar mixture for at least 20 minutes.  Be sure to swish water in between legs to get as much in vagina as possible.  This soak should be done after sexual intercourse and menstrual cycles.   ° °GO WHITE: °Soap: UNSCENTED Dove (white box light green writing) °Laundry detergent (underwear)- Dreft or [employer] unscented °WHITE 100% cotton panties (NOT just cotton crouch) °Sanitary napkin/panty liners: UNSCENTED.  If it doesn't SAY unscented it can have a scent/perfume    °NO PERFUMES OR LOTIONS OR POTIONS in the vulvar area (may use regular [place]) °Condoms: hypoallergenic only. Non dyed (no color) °Toilet papers: white only °Wash clothes: use a separate wash cloth. WHITE.  Wash in Dreft.  ° °You can purchase Tea Tree Oil locally at: ° °[employer] °[address] °[place] [address] ° °[employer] [employer] °[address] °[place] [address] ° °Advise that these alternatives will not replace the need to be evaluated if symptoms persist. You will need to seek care at an OB/GYN provider. ° °

## 2021-12-30 NOTE — Progress Notes (Signed)
GYNECOLOGY ANNUAL PREVENTATIVE CARE ENCOUNTER NOTE  Subjective:   Gina Banks is a 46 y.o. [phone] female here for a routine annual gynecologic exam.  Current complaints: vaginal odor around cycle.     Menses are regular periods every 30 days  Denies abnormal vaginal bleeding, discharge, pelvic pain, problems with intercourse or other gynecologic concerns.    Gynecologic History Patient's last menstrual period was 12/18/2021. Contraception: IUD Last Pap: 2021 NIL. Had ASCUS HPV neg in 2020 Last mammogram: 2020. Results were: normal  Health Maintenance Due  Topic Date Due   COVID-19 Vaccine (1) Never done   COLONOSCOPY (Pts 45-38yrs Insurance coverage will need to be confirmed)  Never done   MAMMOGRAM  01/10/2020   INFLUENZA VACCINE  Never done    The following portions of the patient's history were reviewed and updated as appropriate: allergies, current medications, past family history, past medical history, past social history, past surgical history and problem list.  Review of Systems Pertinent items are noted in HPI.   Objective:  BP 123/88   Pulse 84   Ht 5' 7.5" (1.715 m)   Wt 277 lb 3.2 oz (125.7 kg)   LMP 12/18/2021   BMI 42.77 kg/m  CONSTITUTIONAL: Well-developed, well-nourished female in no acute distress.  HENT:  Normocephalic, atraumatic, External right and left ear normal. Oropharynx is clear and moist EYES:  No scleral icterus.  NECK: Normal range of motion, supple, no masses.  Normal thyroid.  SKIN: Skin is warm and dry. No rash noted. Not diaphoretic. No erythema. No pallor. NEUROLOGIC: Alert and oriented to person, place, and time. Normal reflexes, muscle tone coordination. No cranial nerve deficit noted. PSYCHIATRIC: Normal mood and affect. Normal behavior. Normal judgment and thought content. CARDIOVASCULAR: Normal heart rate noted, regular rhythm. 2+ distal pulses. RESPIRATORY: Effort and breath sounds normal, no problems with  respiration noted. BREASTS: Symmetric in size. No masses, skin changes, nipple drainage, or lymphadenopathy. ABDOMEN: Soft,  no distention noted.  No tenderness, rebound or guarding.  PELVIC: Normal appearing external genitalia; normal appearing vaginal mucosa and cervix.  Cervix is very anterior and specula exam was challenging. Thin/white/Homogenous discharge noted.  + odor. Pap smear obtained.  Normal uterine size, no other palpable masses, no uterine or adnexal tenderness. IUD strings palpated.  MUSCULOSKELETAL: Normal range of motion.   Assessment and Plan:  1) Annual gynecologic examination with pap smear:  Will follow up results of pap smear and manage accordingly. STI screening desired Yes- vaginal only. Routine preventative health maintenance measures emphasized. Reviewed perimenopausal symptoms and management.   2) Contraception counseling:  Patient has IUD- currently in place.  Likes methodShe will follow up in  1year for surveillance.  She was told to call with any further questions, or with any concerns about this method of contraception.  Emphasized use of condoms 100% of the time for STI prevention.  1. Well woman exam with routine gynecological exam Declined blood work CBE WNL Pap collected Reviewed CRC screening acepted referral today - MM Digital Screening; Future - Cytology - PAP( [place]) - Cervicovaginal ancillary only( [place])  2. Colon cancer screening - Ambulatory referral to Gastroenterology  3. Screening for cervical cancer - Cytology - PAP( [place])  4. Herpes genitalis in women Refilled for 1year - valACYclovir (VALTREX) 1000 MG tablet; Take 1 tablet (1,000 mg total) by mouth 2 (two) times daily.  Dispense: 180 tablet; Refill: 2   Please refer to After Visit Summary for other counseling recommendations.  No follow-ups on file.  Federico Flake, MD, MPH, ABFM Attending Physician Center for [facility]

## 2022-01-03 LAB — CERVICOVAGINAL ANCILLARY ONLY
Bacterial Vaginitis (gardnerella): POSITIVE — AB
Candida Glabrata: NEGATIVE
Candida Vaginitis: NEGATIVE
Comment: NEGATIVE
Comment: NEGATIVE
Comment: NEGATIVE

## 2022-01-03 MED ORDER — METRONIDAZOLE 500 MG PO TABS: 500.0000 mg | ORAL_TABLET | Freq: Two times a day (BID) | ORAL | 0 refills | Status: DC

## 2022-01-03 NOTE — Addendum Note (Signed)
Addended by: Geanie Berlin on: 01/03/2022 03:24 PM   Modules accepted: Orders

## 2022-01-04 NOTE — Progress Notes (Signed)
RX sent by MD. TC to pt to verify she is aware of BV and RX. Pt reports awareness. All questions answered. MyChart education sent.

## 2022-01-05 LAB — CYTOLOGY - PAP
Adequacy: ABSENT
Diagnosis: NEGATIVE

## 2022-01-11 ENCOUNTER — Ambulatory Visit
Admission: RE | Admit: 2022-01-11 | Discharge: 2022-01-11 | Disposition: A | Discharge status: Home / Self Care | Payer: BC Managed Care – PPO | Source: Ambulatory Visit | Attending: Family Medicine | Admitting: Family Medicine

## 2022-01-11 DIAGNOSIS — Z01419 Encounter for gynecological examination (general) (routine) without abnormal findings: Secondary | ICD-10-CM

## 2022-01-13 ENCOUNTER — Other Ambulatory Visit: Payer: Self-pay | Admitting: Family Medicine

## 2022-01-13 DIAGNOSIS — R928 Other abnormal and inconclusive findings on diagnostic imaging of breast: Secondary | ICD-10-CM

## 2022-01-27 ENCOUNTER — Ambulatory Visit
Admission: RE | Admit: 2022-01-27 | Discharge: 2022-01-27 | Disposition: A | Discharge status: Home / Self Care | Payer: BC Managed Care – PPO | Source: Ambulatory Visit | Attending: Family Medicine | Admitting: Family Medicine

## 2022-01-27 ENCOUNTER — Ambulatory Visit: Payer: BC Managed Care – PPO

## 2022-01-27 DIAGNOSIS — R928 Other abnormal and inconclusive findings on diagnostic imaging of breast: Secondary | ICD-10-CM

## 2022-02-09 ENCOUNTER — Telehealth: Payer: Self-pay

## 2022-02-09 NOTE — Telephone Encounter (Signed)
No show Tele PV 8am. No answer 8am, 8:03am, 8:07am.  Message left to please call back for PV.  Pt called and cancelled PV after 8:30.

## 2022-02-23 ENCOUNTER — Encounter: Payer: BC Managed Care – PPO | Admitting: Internal Medicine

## 2022-03-29 ENCOUNTER — Ambulatory Visit: Payer: BC Managed Care – PPO | Admitting: Family Medicine

## 2022-05-07 ENCOUNTER — Other Ambulatory Visit: Payer: Self-pay | Admitting: Obstetrics

## 2022-05-07 DIAGNOSIS — I10 Essential (primary) hypertension: Secondary | ICD-10-CM

## 2023-01-10 ENCOUNTER — Other Ambulatory Visit: Payer: Self-pay | Admitting: Family Medicine

## 2023-01-10 DIAGNOSIS — Z0000 Encounter for general adult medical examination without abnormal findings: Secondary | ICD-10-CM

## 2023-01-25 ENCOUNTER — Ambulatory Visit: Payer: BC Managed Care – PPO

## 2023-02-20 ENCOUNTER — Encounter: Payer: Self-pay | Admitting: Advanced Practice Midwife

## 2023-02-20 ENCOUNTER — Ambulatory Visit (INDEPENDENT_AMBULATORY_CARE_PROVIDER_SITE_OTHER): Payer: BC Managed Care – PPO | Admitting: Advanced Practice Midwife

## 2023-02-20 VITALS — BP 135/94 | HR 70 | Ht 67.5 in | Wt 280.0 lb

## 2023-02-20 DIAGNOSIS — R102 Pelvic and perineal pain: Secondary | ICD-10-CM | POA: Diagnosis not present

## 2023-02-20 DIAGNOSIS — Z01419 Encounter for gynecological examination (general) (routine) without abnormal findings: Secondary | ICD-10-CM

## 2023-02-20 DIAGNOSIS — R5383 Other fatigue: Secondary | ICD-10-CM | POA: Diagnosis not present

## 2023-02-20 DIAGNOSIS — T839XXA Unspecified complication of genitourinary prosthetic device, implant and graft, initial encounter: Secondary | ICD-10-CM

## 2023-02-20 DIAGNOSIS — R92333 Mammographic heterogeneous density, bilateral breasts: Secondary | ICD-10-CM | POA: Insufficient documentation

## 2023-02-20 DIAGNOSIS — N921 Excessive and frequent menstruation with irregular cycle: Secondary | ICD-10-CM | POA: Diagnosis not present

## 2023-02-20 DIAGNOSIS — G8929 Other chronic pain: Secondary | ICD-10-CM

## 2023-02-20 NOTE — Progress Notes (Signed)
Subjective:     Gina Banks is a 47 y.o. female here at [facility] for a routine exam.  Current complaints: longer menstrual cycles, ~ 45 days apart with increased cramping x 1 year, and pt reports --fatigue.  Personal and family health history reviewed: yes.  Do you have a primary care provider? No, list provided  Do you feel safe at home? yes  Flowsheet Row Office Visit from 02/20/2023 in [facility] Total Score 0       Health Maintenance Due  Topic Date Due   Colonoscopy  Never done   INFLUENZA VACCINE  11/30/2022   COVID-19 Vaccine (1 - 2023-24 season) Never done   MAMMOGRAM  01/12/2023     Risk factors for chronic health problems: Smoking: never Alchohol/how much: occasional Pt BMI: Body mass index is 43.21 kg/m.   Gynecologic History Patient's last menstrual period was 01/12/2023 (approximate). Contraception: IUD Last Pap: 12/30/21. Results were: normal Last mammogram: 01/11/22. Results were: normal  Obstetric History OB History  Gravida Para Term Preterm AB Living  5 2 2   3 2   SAB IAB Ectopic Multiple Live Births  2 1   0 2    # Outcome Date GA Lbr Len/2nd Weight Sex Type Anes PTL Lv  5 Term 07/03/15 [redacted]w[redacted]d 00:14 / 00:03 6 lb 0.3 oz (2.73 kg) F Vag-Spont None  LIV  4 SAB 2016     SAB     3 SAB 2015          2 Term 04/13/03 [redacted]w[redacted]d  8 lb 9 oz (3.884 kg) M Vag-Spont None  LIV  1 IAB 1990        DEC     The following portions of the patient's history were reviewed and updated as appropriate: allergies, current medications, past family history, past medical history, past social history, past surgical history, and problem list.  Review of Systems Pertinent items noted in HPI and remainder of comprehensive ROS otherwise negative.    Objective:  BP (!) 135/94   Pulse 70   Ht 5' 7.5" (1.715 m)   Wt 280 lb (127 kg)   LMP 01/12/2023 (Approximate)   BMI 43.21 kg/m   VS reviewed, nursing note  reviewed,  Constitutional: well developed, well nourished, no distress HEENT: normocephalic, thyroid without enlargement or mass HEART: RRR, no murmurs rubs/gallops RESP: clear and equal to auscultation bilaterally in all lobes  Breast Exam:   exam performed: right breast without abnormal mass, skin or nipple changes or axillary nodes, left breast without abnormal mass, skin or nipple changes or axillary nodes Abdomen: soft Neuro: alert and oriented x 3 Skin: warm, dry Psych: affect normal Pelvic exam: Bimanual exam: Cervix 0/long/high, firm, anterior, neg CMT, uterus nontender, nonenlarged, adnexa without tenderness, enlargement, or mass        Assessment/Plan:   1. Encounter for annual routine gynecological examination   2. Other fatigue  - CBC - TSH  3. Complication of intrauterine device (IUD), unspecified complication, initial encounter (HCC)  - US PELVIC COMPLETE WITH TRANSVAGINAL; Future  4. Chronic pelvic pain in female --Pain x 1 year, prior to this year, no pain with IUD --Will evaluate for IUD placement, fibroids, etc with [place]  - US PELVIC COMPLETE WITH TRANSVAGINAL; Future  5. Prolonged menstrual cycle --Cycle every 45 days, and painful, otherwise normal --Likely perimenopause but will evaluate with [place]  - US PELVIC COMPLETE WITH TRANSVAGINAL; Future  6. Heterogeneously dense tissue of both breasts on mammography --Mammogram in 2023 with dense breasts and follow up diagnostic imaging which was normal.   --Pt mother has had breast cancer 4 times per pt  --Mammogram scheduled 11/14/2 --Discussed breast density and increased risks of BC, pt may consider MRI but it may not be covered by insurance.      Return in about 1 year (around 02/20/2024).   Sharen Counter, CNM 5:34 PM

## 2023-02-21 LAB — CBC
Hematocrit: 38.3 % (ref 34.0–46.6)
Hemoglobin: 12.1 g/dL (ref 11.1–15.9)
MCH: 28.4 pg (ref 26.6–33.0)
MCHC: 31.6 g/dL (ref 31.5–35.7)
MCV: 90 fL (ref 79–97)
Platelets: 274 10*3/uL (ref 150–450)
RBC: 4.26 x10E6/uL (ref 3.77–5.28)
RDW: 12.2 % (ref 11.7–15.4)
WBC: 4.9 10*3/uL (ref 3.4–10.8)

## 2023-02-21 LAB — TSH: TSH: 1.69 u[IU]/mL (ref 0.450–4.500)

## 2023-03-15 ENCOUNTER — Ambulatory Visit
Admission: RE | Admit: 2023-03-15 | Discharge: 2023-03-15 | Disposition: A | Discharge status: Home / Self Care | Payer: BC Managed Care – PPO | Source: Ambulatory Visit | Attending: Family Medicine | Admitting: Family Medicine

## 2023-03-15 DIAGNOSIS — Z0000 Encounter for general adult medical examination without abnormal findings: Secondary | ICD-10-CM

## 2023-05-02 ENCOUNTER — Other Ambulatory Visit: Payer: Self-pay | Admitting: Obstetrics

## 2023-05-02 DIAGNOSIS — I10 Essential (primary) hypertension: Secondary | ICD-10-CM

## 2023-05-22 ENCOUNTER — Encounter: Payer: Self-pay | Admitting: Advanced Practice Midwife

## 2023-05-26 ENCOUNTER — Ambulatory Visit (HOSPITAL_BASED_OUTPATIENT_CLINIC_OR_DEPARTMENT_OTHER): Payer: 59

## 2023-06-08 ENCOUNTER — Encounter: Payer: Self-pay | Admitting: Advanced Practice Midwife

## 2023-07-03 ENCOUNTER — Encounter: Payer: Self-pay | Admitting: Advanced Practice Midwife

## 2023-07-03 ENCOUNTER — Ambulatory Visit: Payer: 59 | Admitting: Advanced Practice Midwife

## 2023-07-03 ENCOUNTER — Other Ambulatory Visit (HOSPITAL_COMMUNITY)
Admission: RE | Admit: 2023-07-03 | Discharge: 2023-07-03 | Disposition: A | Discharge status: Home / Self Care | Source: Ambulatory Visit | Attending: Advanced Practice Midwife | Admitting: Advanced Practice Midwife

## 2023-07-03 VITALS — BP 130/91 | HR 70 | Ht 67.5 in | Wt 289.4 lb

## 2023-07-03 DIAGNOSIS — Z01419 Encounter for gynecological examination (general) (routine) without abnormal findings: Secondary | ICD-10-CM | POA: Diagnosis not present

## 2023-07-03 DIAGNOSIS — N6310 Unspecified lump in the right breast, unspecified quadrant: Secondary | ICD-10-CM

## 2023-07-03 DIAGNOSIS — N921 Excessive and frequent menstruation with irregular cycle: Secondary | ICD-10-CM | POA: Diagnosis not present

## 2023-07-03 DIAGNOSIS — N6011 Diffuse cystic mastopathy of right breast: Secondary | ICD-10-CM

## 2023-07-03 DIAGNOSIS — Z124 Encounter for screening for malignant neoplasm of cervix: Secondary | ICD-10-CM | POA: Insufficient documentation

## 2023-07-03 DIAGNOSIS — N6320 Unspecified lump in the left breast, unspecified quadrant: Secondary | ICD-10-CM

## 2023-07-03 DIAGNOSIS — N939 Abnormal uterine and vaginal bleeding, unspecified: Secondary | ICD-10-CM | POA: Diagnosis not present

## 2023-07-03 DIAGNOSIS — T8332XA Displacement of intrauterine contraceptive device, initial encounter: Secondary | ICD-10-CM

## 2023-07-03 DIAGNOSIS — Z30431 Encounter for routine checking of intrauterine contraceptive device: Secondary | ICD-10-CM

## 2023-07-03 DIAGNOSIS — N6012 Diffuse cystic mastopathy of left breast: Secondary | ICD-10-CM

## 2023-07-03 DIAGNOSIS — Z30013 Encounter for initial prescription of injectable contraceptive: Secondary | ICD-10-CM | POA: Diagnosis not present

## 2023-07-03 MED ORDER — MEDROXYPROGESTERONE ACETATE 150 MG/ML IM SUSP
150.0000 mg | Freq: Once | INTRAMUSCULAR | Status: AC
  Administered 2023-07-03: 150 mg via INTRAMUSCULAR

## 2023-07-03 NOTE — Progress Notes (Signed)
 Subjective:     Gina Banks is a 48 y.o. female here at [facility] for a routine exam.  Current complaints: periods heavier and more frequent recently, was amenorrheic with Mirena IUD.  Personal and family health history reviewed: yes.  Do you have a primary care provider? yes Do you feel safe at home? yes  Flowsheet Row Office Visit from 02/20/2023 in [facility] for [facility] Total Score 0       Health Maintenance Due  Topic Date Due   Colonoscopy  Never done   INFLUENZA VACCINE  Never done   COVID-19 Vaccine (1 - 2024-25 season) Never done     Risk factors for chronic health problems: Smoking: Alchohol/how much: Pt BMI: Body mass index is 44.66 kg/m.   Gynecologic History Patient's last menstrual period was 06/02/2023 (approximate). Contraception: IUD Last Pap: 2023. Results were: normal Last mammogram: 03/2023. Results were: normal  Obstetric History OB History  Gravida Para Term Preterm AB Living  5 2 2  3 2   SAB IAB Ectopic Multiple Live Births  2 1  0 2    # Outcome Date GA Lbr Len/2nd Weight Sex Type Anes PTL Lv  5 Term 07/03/15 [redacted]w[redacted]d 00:14 / 00:03 6 lb 0.3 oz (2.73 kg) F Vag-Spont None  LIV  4 SAB 2016     SAB     3 SAB 2015          2 Term 04/13/03 [redacted]w[redacted]d  8 lb 9 oz (3.884 kg) M Vag-Spont None  LIV  1 IAB 1990        DEC     The following portions of the patient's history were reviewed and updated as appropriate: allergies, current medications, past family history, past medical history, past social history, past surgical history, and problem list.  Review of Systems Pertinent items noted in HPI and remainder of comprehensive ROS otherwise negative.    Objective:  BP (!) 133/96   Pulse 78   Ht 5' 7.5" (1.715 m)   Wt 289 lb 6.4 oz (131.3 kg)   LMP 06/02/2023 (Approximate)   BMI 44.66 kg/m   VS reviewed, nursing note reviewed,  Constitutional: well developed, well nourished, no distress HEENT:  normocephalic, thyroid without enlargement or mass HEART: RRR, no murmurs rubs/gallops RESP: clear and equal to auscultation bilaterally in all lobes  Breast Exam: exam performed: right breast with multiple ropelike, mobile masses in upper outer quadrant, no skin or nipple changes or axillary nodes, left breast with multiple ropelike, mobile masses in upper outer quadrant, no skin or nipple changes or axillary nodes Abdomen: soft Neuro: alert and oriented x 3 Skin: warm, dry Psych: affect normal Pelvic exam: Performed: Cervix pink, visually closed, without lesion, scant white creamy discharge, vaginal walls and external genitalia normal Bimanual exam: Cervix 0/long/high, firm, anterior, neg CMT, uterus nontender, nonenlarged, adnexa without tenderness, enlargement, or mass        Assessment/Plan:   1. Encounter for annual routine gynecological examination   2. Encounter for screening for cervical cancer (Primary) --Pt with family hx cancer, prefers to repeat Pap  - Cytology - PAP( [place])  3. Breakthrough bleeding with IUD   4. Abnormal uterine bleeding (AUB) --AUB with strings not visible.  May be breakthrough bleeding but also concern that IUD is not present.  Pt liked Depo in the past.  Single dose given today to improve bleeding.  Will [place] to check IUD.  - medroxyPROGESTERone (  DEPO-PROVERA) injection 150 mg - US Pelvis Complete; Future  5. Intrauterine contraceptive device threads lost, initial encounter --String not visible on today's exam, given change in bleeding pattern, will send for [place] - US Pelvis Complete; Future  6. Fibrocystic breast changes of both breasts  - MM Digital Diagnostic Bilat; Future - [place] LIMITED ULTRASOUND INCLUDING AXILLA LEFT BREAST ; Future - [place] LIMITED ULTRASOUND INCLUDING AXILLA RIGHT BREAST; Future  7. Masses of both breasts  - MM Digital Diagnostic Bilat; Future - [place] LIMITED ULTRASOUND INCLUDING AXILLA LEFT BREAST ; Future - [place]  LIMITED ULTRASOUND INCLUDING AXILLA RIGHT BREAST; Future    No follow-ups on file.   Sharen Counter, CNM 8:22 AM

## 2023-07-03 NOTE — Progress Notes (Signed)
 Pt presents for annual. Needs her IUD checked to see if its still in place

## 2023-07-05 ENCOUNTER — Other Ambulatory Visit

## 2023-07-09 LAB — CYTOLOGY - PAP
Adequacy: ABSENT
Comment: NEGATIVE
Diagnosis: NEGATIVE
High risk HPV: NEGATIVE

## 2023-07-23 ENCOUNTER — Ambulatory Visit (HOSPITAL_BASED_OUTPATIENT_CLINIC_OR_DEPARTMENT_OTHER)

## 2023-08-04 ENCOUNTER — Other Ambulatory Visit: Payer: Self-pay | Admitting: Obstetrics

## 2023-08-04 DIAGNOSIS — I10 Essential (primary) hypertension: Secondary | ICD-10-CM

## 2023-08-07 ENCOUNTER — Ambulatory Visit
Admission: RE | Admit: 2023-08-07 | Discharge: 2023-08-07 | Disposition: A | Discharge status: Home / Self Care | Source: Ambulatory Visit | Attending: Advanced Practice Midwife | Admitting: Advanced Practice Midwife

## 2023-08-07 DIAGNOSIS — N6310 Unspecified lump in the right breast, unspecified quadrant: Secondary | ICD-10-CM

## 2023-08-07 DIAGNOSIS — N6011 Diffuse cystic mastopathy of right breast: Secondary | ICD-10-CM

## 2023-11-24 NOTE — ED Provider Notes (Signed)
 EMERGENCY DEPARTMENT PROVIDER NOTE [facility]   Patient Name: Gina Banks Date of Birth:  1975/09/22 MRN: [id number]  History of Present Illness  HPI  Chief Complaint  Patient presents with  . Vomiting  . Diarrhea    Gina Banks is a 48 y.o. female with no significant past medical history presenting to the emergency department seeking evaluation for N/V/D.  Patient states she has begun was last night and woke up this morning approximately 6 7:00 a.m. with nausea, vomiting, diarrhea.  Denies any fevers, chills, cough, dysuria, frequency, or any other pertinent complaints at this time.  Denies any blood in her vomit or stools.  She states generalized abdominal cramping as well.  Patient is otherwise in her baseline state health with no other complaints.    History provided by:  Patient Language interpreter used: No        Medical and Social History  Patient History Medical History[1] Surgical History[2] Family History[3] Social History[4]    Review of Systems  Review of Systems Review of Systems  Constitutional:  Negative for chills and fever.  HENT: Negative.    Eyes:  Negative for visual disturbance.  Respiratory: Negative.    Cardiovascular: Negative.   Gastrointestinal:  Positive for abdominal pain, diarrhea, nausea and vomiting. Negative for abdominal distention, anal bleeding, blood in stool and constipation.  Genitourinary:  Negative for dysuria, frequency and urgency.  Musculoskeletal:  Negative for arthralgias, back pain and myalgias.  Skin: Negative.   Hematological:  Does not bruise/bleed easily.      Physical Exam  Physical Exam ED Triage Vitals [11/24/23 1619]  Temperature Heart Rate Resp BP SpO2  36.7 C (98 F) (!) 98 18 (!) 169/89 100 %    Temp Source Heart Rate Source Patient Position BP Location FiO2 (%)  Temporal -- -- -- --   Physical Exam Vitals and nursing note reviewed.  Constitutional:       General: She is not in acute distress.    Appearance: She is well-developed. She is not ill-appearing, toxic-appearing or diaphoretic.     Comments: Well-developed and well-nourished female that appears to be in no acute distress this time.  She is resting comfortably in bed upon history and physical examination.  HENT:     Head: Normocephalic and atraumatic.     Mouth/Throat:     Mouth: Mucous membranes are moist.  Eyes:     Conjunctiva/sclera: Conjunctivae normal.  Cardiovascular:     Rate and Rhythm: Normal rate and regular rhythm.     Heart sounds: No murmur heard.    No friction rub.  Pulmonary:     Effort: Pulmonary effort is normal. No respiratory distress.     Breath sounds: Normal breath sounds. No stridor. No wheezing, rhonchi or rales.  Abdominal:     General: Abdomen is flat. There is no distension.     Palpations: Abdomen is soft.     Tenderness: There is no abdominal tenderness. There is no right CVA tenderness, left CVA tenderness, guarding or rebound.  Musculoskeletal:     Cervical back: Normal range of motion and neck supple.  Skin:    General: Skin is warm and dry.  Neurological:     General: No focal deficit present.     Mental Status: She is alert and oriented to person, place, and time. Mental status is at baseline.  Psychiatric:        Mood and Affect: Mood normal.  Behavior: Behavior normal.        ED Course and Medical Decision Making   Medical Decision Making This patient presents with nausea, vomiting & diarrhea. Differential diagnosis includes possible acute gastroenteritis. Abdominal exam without peritoneal signs. Currently euvolemic without evidence of dehydration. Doubt invasive bacteria causing diarrhea such as C diff (no recent antibiotics), shiga toxin (non bloody). No recent travel. Patient is not immunocompromised. Diarrhea is non bloody so less likely inflammatory bowel disease. No evidence of surgical abdomen or other acute medical  emergency including bowel obstruction, viscus perforation, vascular catastrophe, atypical appendicitis, acute cholecystitis, UGIB, thyrotoxicosis, or diverticulitis at this time. Presentation not consistent with other acute, emergent causes of vomiting / diarrhea at this time. No indication for abdominal imaging.  His workup today reassuring.  No urinary tract infection.  Quad screen negative.  Most likely gastroenteritis causing patient's symptoms.  Abdomen is completely benign with no peritoneal signs at this time.  No indication for blood work or imaging at this time.  Discussed results in depth with patient she is understanding in agreement discharged home.  She is tolerating p.o. fluids without difficulty.  No episodes of emesis or diarrhea while in the emergency department.  Patient currently stable for discharge home.  Afebrile.  Non tachycardic non tachypneic.  Patient satting 100% on room air without any difficulty.  Patient currently stable for discharge home.  Strict return precautions given to patient to return to the emergency department.  Problems Addressed: Nausea vomiting and diarrhea: acute illness or injury  Amount and/or Complexity of Data Reviewed Labs: ordered. Decision-making details documented in ED Course. Radiology:     Details: I considered  performing radiology exams on this patient, however deferred due to the patient's overall well appearance   Risk OTC drugs. Prescription drug management. Decision regarding hospitalization.    ED Course as of 11/24/23 1845  Bernardino Dover McAvoy's Documentation  Sat Nov 24, 2023  1634 BP(!): 169/89  1634 Heart Rate(!): 98  1634 Resp: 18  1634 SpO2: 100 %  1634 Temperature: 36.7 C (98 F)  1634 Weight: 131 kg  1802 SARS COV2-FLU-RSV-PCR (Cepheid-In House) Negative  1809 SARS COV2-FLU-RSV-PCR (Cepheid-In House) Negative  1809 Urinalysis, reflex to microscopic(!) No urinary tract infection  1833 beta-hCG, qualitative, urine:  Negative      Clinical Impressions as of 11/24/23 1845  Nausea vomiting and diarrhea    ED Disposition  Discharge   ED Discharge Medications   ED Prescriptions     Medication Sig Dispense Start Date End Date Auth. Provider   dicyclomine (BENTYL) 20 mg tablet Take 1 tablet (20 mg total) by mouth four times a day as needed (abdominal pain / cramping). 30 tablet 11/24/2023 12/24/2023 Bernardino Dover Arias, PA   ondansetron  (ZOFRAN ) 4 mg tablet Take 1 tablet (4 mg total) by mouth every 6 (six) hours. 12 tablet 11/24/2023 -- Bernardino Dover Arias, PA      Only medications prescribed or modified are during this ED encounter are included in this list.       [1] No past medical history on file. [2] No past surgical history on file. [3] No family history on file. [4] Social History Tobacco Use  . Smoking status: Never  . Smokeless tobacco: Never  Substance Use Topics  . Alcohol use: Yes    Comment: socially  . Drug use: Not Currently   Bernardino Dover [place], [place] 11/24/23 1845

## 2023-11-24 NOTE — ED Provider Notes (Signed)
 EMERGENCY DEPARTMENT PHYSICIAN ATTESTATION NOTE [facility]  Patient Name: Gina Banks Date of Birth:  05-Jun-1975 MRN: [id number]  During the course of my Emergency Department Shift, I was directly available to the Emergency Medicine Physician Assistant or Nurse Practicioner caring for this patient.    Marsa VEAR Bark, MD 11/24/23 [phone]

## 2023-12-04 NOTE — Progress Notes (Signed)
 Pt states was seen in ER July 26th with nausea and vomiting pt states she doesn't feel any better and now has swollen abdomin pt states she didn't complete medications

## 2023-12-04 NOTE — Progress Notes (Signed)
 Subjective  Patient is a 48 y.o. female here c/w abdominal pain x 10 days.  She reports started with n /v and diarrhea.  Those sx resolved, but pain remains.  Denies f/c, URI sx, cough, wheezing, S/B, dysuria, hematuria, urgency, vaginal dc.    Review of Systems  Constitutional:  Negative for chills, fatigue and fever.  HENT:  Negative for congestion, ear discharge, rhinorrhea and sore throat.   Eyes:  Negative for discharge and redness.  Respiratory:  Negative for cough, chest tightness, shortness of breath and wheezing.   Gastrointestinal:  Positive for abdominal pain. Negative for blood in stool, constipation, diarrhea, nausea and vomiting.  Genitourinary:  Positive for frequency. Negative for decreased urine volume, dysuria, flank pain, hematuria, urgency, vaginal bleeding, vaginal discharge and vaginal pain.  Musculoskeletal:  Negative for arthralgias, back pain and myalgias.  Skin:  Negative for rash.  Allergic/Immunologic: Negative for environmental allergies and immunocompromised state.  Neurological:  Negative for light-headedness and headaches.  Hematological:  Negative for adenopathy. Does not bruise/bleed easily.  Psychiatric/Behavioral:  Negative for confusion and sleep disturbance.        Current Problem List: There is no problem list on file for this patient.   Current Medications on file: Current Outpatient Medications on File Prior to Visit  Medication Sig Dispense Refill  . amLODIPine  (Norvasc ) 5 MG tablet Take 5 mg by mouth 1 (one) time each day.    . amLODIPine  (Norvasc ) 5 MG tablet Take 1 tablet by mouth 1 (one) time each day.    . carvedilol  (Coreg ) 25 MG tablet TAKE 1 TABLET (25 MG TOTAL) BY MOUTH 2 (TWO) TIMES DAILY WITH A MEAL.    . carvedilol  (Coreg ) 25 MG tablet Take 25 mg by mouth.    . ibuprofen  800 MG tablet Take 1 tablet by mouth 3 (three) times a day if needed.    . Levonorgestrel  20 MCG/DAY intrauterine device 1 each by Intrauterine route.    .  trimethoprim-polymyxin b (Polytrim) ophthalmic solution Administer 1 drop into the left eye every 6 (six) hours. 10 mL 0  . valACYclovir  (Valtrex ) 1 g tablet Take 1 tablet by mouth in the morning and 1 tablet before bedtime.     No current facility-administered medications on file prior to visit.    Past Medical History: Past Medical History:  Diagnosis Date  . Hypertension      Past Surgical History: Past Surgical History:  Procedure Laterality Date  . BREAST CYST EXCISION Right   . CHOLECYSTECTOMY    . GASTRIC BYPASS  2006    Social History: Social History   Socioeconomic History  . Marital status: Single    Spouse name: Not on file  . Number of children: Not on file  . Years of education: Not on file  . Highest education level: Not on file  Occupational History  . Not on file  Tobacco Use  . Smoking status: Never  . Smokeless tobacco: Never  Vaping Use  . Vaping status: Never Used  Substance and Sexual Activity  . Alcohol use: Not Currently    Alcohol/week: 6.0 standard drinks of alcohol    Types: 6 Standard drinks or equivalent per week  . Drug use: Never  . Sexual activity: Not Currently  Other Topics Concern  . Not on file  Social History Narrative  . Not on file    Allergies: No Known Allergies  Objective  Vitals:   12/04/23 1249  BP: 114/87  Pulse: 94  Resp: 18  Temp: 37.1 C (98.8 F)  TempSrc: Oral  SpO2: 98%  Weight: 126 kg  Height: 5' 7     No results found.      Physical Exam Vitals and nursing note reviewed.  Constitutional:      General: She is not in acute distress.    Appearance: Normal appearance. She is not ill-appearing.  HENT:     Head: Normocephalic and atraumatic.     Nose: Nose normal. No congestion or rhinorrhea.     Mouth/Throat:     Mouth: Mucous membranes are moist.     Pharynx: No oropharyngeal exudate or posterior oropharyngeal erythema.  Eyes:     General: No scleral icterus.    Conjunctiva/sclera:  Conjunctivae normal.     Pupils: Pupils are equal, round, and reactive to light.  Cardiovascular:     Rate and Rhythm: Normal rate and regular rhythm.     Heart sounds: No murmur heard. Pulmonary:     Effort: Pulmonary effort is normal. No respiratory distress.     Breath sounds: Normal breath sounds. No wheezing, rhonchi or rales.  Abdominal:     Tenderness: There is generalized abdominal tenderness and tenderness in the suprapubic area. There is no right CVA tenderness, left CVA tenderness, guarding or rebound. Negative signs include Murphy's sign and McBurney's sign.     Hernia: No hernia is present.  Musculoskeletal:        General: Normal range of motion.     Cervical back: Normal range of motion. No rigidity.  Lymphadenopathy:     Cervical: No cervical adenopathy.  Skin:    General: Skin is warm.     Capillary Refill: Capillary refill takes less than 2 seconds.  Neurological:     General: No focal deficit present.     Mental Status: She is alert and oriented to person, place, and time.  Psychiatric:        Mood and Affect: Mood normal.        Behavior: Behavior normal.      Results: Results for orders placed or performed in visit on 12/04/23  POCT urinalysis dipstick manually resulted  Component Result   Color, UA Yellow   Clarity, UA Cloudy (A)   Glucose, UA Negative   Bilirubin, UA 1+ (A)   Ketones, UA Negative   Spec Grav, UA 1.025   Blood, UA Negative   pH, UA 6.0   Protein, UA Trace (A)   Urobilinogen, UA Negative   Leukocytes, UA Negative   Nitrite, UA Negative  POCT PREGNANCY  Component Result   Preg Test, Ur Negative   Internal Quality Control Pass       Assessment  Guelda was seen today for abdominal pain. Diagnoses and all orders for this visit: Generalized abdominal pain (Primary) -     POCT urinalysis dipstick manually resulted -     POCT PREGNANCY -     Urine culture -     hyoscyamine (Levsin/SL) 0.125 MG SL tablet; Take 1 tablet (0.125  mg total) by mouth every 6 (six) hours if needed for cramping. Frequency of micturition -     POCT urinalysis dipstick manually resulted -     POCT PREGNANCY -     Urine culture Other orders -     Cancel: POCT urinalysis dipstick manually resulted   Follow up with GI / OB or PCP  I will send urine given her suprapubic pain and frequency, though UA rather unremarkable     MDM:  1 Acute, uncomplicated illness or injury     Unique ordered tests: Three+     Risk:: Moderate

## 2023-12-06 ENCOUNTER — Encounter (HOSPITAL_COMMUNITY): Payer: Self-pay

## 2023-12-06 ENCOUNTER — Inpatient Hospital Stay (HOSPITAL_COMMUNITY)
Admission: EM | Admit: 2023-12-06 | Discharge: 2023-12-09 | DRG: 372 | Disposition: A | Discharge status: Home / Self Care | Attending: General Surgery | Admitting: General Surgery

## 2023-12-06 ENCOUNTER — Other Ambulatory Visit: Payer: Self-pay

## 2023-12-06 ENCOUNTER — Emergency Department (HOSPITAL_COMMUNITY)

## 2023-12-06 DIAGNOSIS — Z809 Family history of malignant neoplasm, unspecified: Secondary | ICD-10-CM | POA: Diagnosis not present

## 2023-12-06 DIAGNOSIS — Z8249 Family history of ischemic heart disease and other diseases of the circulatory system: Secondary | ICD-10-CM | POA: Diagnosis not present

## 2023-12-06 DIAGNOSIS — K3533 Acute appendicitis with perforation and localized peritonitis, with abscess: Secondary | ICD-10-CM | POA: Diagnosis present

## 2023-12-06 DIAGNOSIS — R7401 Elevation of levels of liver transaminase levels: Secondary | ICD-10-CM | POA: Diagnosis present

## 2023-12-06 DIAGNOSIS — K3532 Acute appendicitis with perforation and localized peritonitis, without abscess: Secondary | ICD-10-CM | POA: Diagnosis present

## 2023-12-06 DIAGNOSIS — I10 Essential (primary) hypertension: Secondary | ICD-10-CM | POA: Diagnosis present

## 2023-12-06 DIAGNOSIS — Z6841 Body Mass Index (BMI) 40.0 and over, adult: Secondary | ICD-10-CM

## 2023-12-06 DIAGNOSIS — Z9884 Bariatric surgery status: Secondary | ICD-10-CM | POA: Diagnosis not present

## 2023-12-06 DIAGNOSIS — Z833 Family history of diabetes mellitus: Secondary | ICD-10-CM

## 2023-12-06 DIAGNOSIS — Z79899 Other long term (current) drug therapy: Secondary | ICD-10-CM | POA: Diagnosis not present

## 2023-12-06 DIAGNOSIS — K3580 Unspecified acute appendicitis: Principal | ICD-10-CM

## 2023-12-06 LAB — CBC WITH DIFFERENTIAL/PLATELET
Abs Immature Granulocytes: 0.05 K/uL (ref 0.00–0.07)
Basophils Absolute: 0.1 K/uL (ref 0.0–0.1)
Basophils Relative: 1 %
Eosinophils Absolute: 0.1 K/uL (ref 0.0–0.5)
Eosinophils Relative: 1 %
HCT: 35.3 % — ABNORMAL LOW (ref 36.0–46.0)
Hemoglobin: 11.4 g/dL — ABNORMAL LOW (ref 12.0–15.0)
Immature Granulocytes: 0 %
Lymphocytes Relative: 12 %
Lymphs Abs: 1.5 K/uL (ref 0.7–4.0)
MCH: 28.1 pg (ref 26.0–34.0)
MCHC: 32.3 g/dL (ref 30.0–36.0)
MCV: 86.9 fL (ref 80.0–100.0)
Monocytes Absolute: 1.6 K/uL — ABNORMAL HIGH (ref 0.1–1.0)
Monocytes Relative: 13 %
Neutro Abs: 9.5 K/uL — ABNORMAL HIGH (ref 1.7–7.7)
Neutrophils Relative %: 73 %
Platelets: 422 K/uL — ABNORMAL HIGH (ref 150–400)
RBC: 4.06 MIL/uL (ref 3.87–5.11)
RDW: 13.7 % (ref 11.5–15.5)
WBC: 12.9 K/uL — ABNORMAL HIGH (ref 4.0–10.5)
nRBC: 0 % (ref 0.0–0.2)

## 2023-12-06 LAB — URINALYSIS, ROUTINE W REFLEX MICROSCOPIC
Bilirubin Urine: NEGATIVE
Glucose, UA: NEGATIVE mg/dL
Hgb urine dipstick: NEGATIVE
Ketones, ur: NEGATIVE mg/dL
Leukocytes,Ua: NEGATIVE
Nitrite: NEGATIVE
Protein, ur: NEGATIVE mg/dL
Specific Gravity, Urine: 1.017 (ref 1.005–1.030)
pH: 5 (ref 5.0–8.0)

## 2023-12-06 LAB — COMPREHENSIVE METABOLIC PANEL WITH GFR
ALT: 62 U/L — ABNORMAL HIGH (ref 0–44)
AST: 52 U/L — ABNORMAL HIGH (ref 15–41)
Albumin: 3.2 g/dL — ABNORMAL LOW (ref 3.5–5.0)
Alkaline Phosphatase: 104 U/L (ref 38–126)
Anion gap: 15 (ref 5–15)
BUN: 6 mg/dL (ref 6–20)
CO2: 25 mmol/L (ref 22–32)
Calcium: 9.7 mg/dL (ref 8.9–10.3)
Chloride: 98 mmol/L (ref 98–111)
Creatinine, Ser: 0.85 mg/dL (ref 0.44–1.00)
GFR, Estimated: >60 mL/min (ref >60)
Glucose, Bld: 92 mg/dL (ref 70–99)
Potassium: 4.9 mmol/L (ref 3.5–5.1)
Sodium: 138 mmol/L (ref 135–145)
Total Bilirubin: 0.4 mg/dL (ref 0.0–1.2)
Total Protein: 8.1 g/dL (ref 6.5–8.1)

## 2023-12-06 LAB — LIPASE, BLOOD: Lipase: 33 U/L (ref 11–51)

## 2023-12-06 LAB — HCG, SERUM, QUALITATIVE: Preg, Serum: NEGATIVE

## 2023-12-06 LAB — I-STAT CG4 LACTIC ACID, ED: Lactic Acid, Venous: 1 mmol/L (ref 0.5–1.9)

## 2023-12-06 MED ORDER — PIPERACILLIN-TAZOBACTAM 3.375 G IVPB
3.3750 g | Freq: Three times a day (TID) | INTRAVENOUS | Status: DC
  Administered 2023-12-07 – 2023-12-09 (×7): 3.375 g via INTRAVENOUS
  Filled 2023-12-06 (×7): qty 50

## 2023-12-06 MED ORDER — POTASSIUM CHLORIDE IN NACL 20-0.9 MEQ/L-% IV SOLN
INTRAVENOUS | Status: AC
  Filled 2023-12-06: qty 1000

## 2023-12-06 MED ORDER — CARVEDILOL 25 MG PO TABS
25.0000 mg | ORAL_TABLET | Freq: Two times a day (BID) | ORAL | Status: DC
  Administered 2023-12-07 – 2023-12-08 (×4): 25 mg via ORAL
  Filled 2023-12-06 (×4): qty 1

## 2023-12-06 MED ORDER — OXYCODONE HCL 5 MG PO TABS
5.0000 mg | ORAL_TABLET | ORAL | Status: DC | PRN
  Administered 2023-12-06 – 2023-12-07 (×2): 5 mg via ORAL
  Administered 2023-12-07: 10 mg via ORAL
  Administered 2023-12-07: 5 mg via ORAL
  Filled 2023-12-06 (×2): qty 1
  Filled 2023-12-06: qty 2
  Filled 2023-12-06 (×2): qty 1

## 2023-12-06 MED ORDER — ZOLPIDEM TARTRATE 5 MG PO TABS: 5.0000 mg | ORAL_TABLET | Freq: Every evening | ORAL | Status: DC | PRN

## 2023-12-06 MED ORDER — DIPHENHYDRAMINE HCL 12.5 MG/5ML PO ELIX: 12.5000 mg | ORAL_SOLUTION | Freq: Four times a day (QID) | ORAL | Status: DC | PRN

## 2023-12-06 MED ORDER — IOHEXOL 350 MG/ML SOLN
75.0000 mL | Freq: Once | INTRAVENOUS | Status: AC | PRN
  Administered 2023-12-06: 75 mL via INTRAVENOUS

## 2023-12-06 MED ORDER — ONDANSETRON 4 MG PO TBDP
4.0000 mg | ORAL_TABLET | Freq: Four times a day (QID) | ORAL | Status: DC | PRN
Start: 2023-12-06 — End: 2023-12-09

## 2023-12-06 MED ORDER — ONDANSETRON HCL 4 MG/2ML IJ SOLN
4.0000 mg | Freq: Once | INTRAMUSCULAR | Status: AC
  Administered 2023-12-06: 4 mg via INTRAVENOUS
  Filled 2023-12-06: qty 2

## 2023-12-06 MED ORDER — ADULT MULTIVITAMIN W/MINERALS CH
1.0000 | ORAL_TABLET | Freq: Every day | ORAL | Status: DC
  Administered 2023-12-07 – 2023-12-08 (×2): 1 via ORAL
  Filled 2023-12-06 (×2): qty 1

## 2023-12-06 MED ORDER — PIPERACILLIN-TAZOBACTAM 3.375 G IVPB 30 MIN
3.3750 g | Freq: Once | INTRAVENOUS | Status: AC
  Administered 2023-12-06: 3.375 g via INTRAVENOUS
  Filled 2023-12-06: qty 50

## 2023-12-06 MED ORDER — MORPHINE SULFATE (PF) 2 MG/ML IV SOLN: 1.0000 mg | INTRAVENOUS | Status: DC | PRN

## 2023-12-06 MED ORDER — SIMETHICONE 80 MG PO CHEW
40.0000 mg | CHEWABLE_TABLET | Freq: Four times a day (QID) | ORAL | Status: DC | PRN
  Administered 2023-12-08: 40 mg via ORAL
  Filled 2023-12-06: qty 1

## 2023-12-06 MED ORDER — THIAMINE HCL 100 MG/ML IJ SOLN
100.0000 mg | Freq: Every day | INTRAMUSCULAR | Status: AC
  Administered 2023-12-06 – 2023-12-08 (×3): 100 mg via INTRAVENOUS
  Filled 2023-12-06 (×3): qty 2

## 2023-12-06 MED ORDER — PANTOPRAZOLE SODIUM 40 MG IV SOLR
40.0000 mg | Freq: Every day | INTRAVENOUS | Status: DC
  Administered 2023-12-06 – 2023-12-08 (×3): 40 mg via INTRAVENOUS
  Filled 2023-12-06 (×3): qty 10

## 2023-12-06 MED ORDER — DIPHENHYDRAMINE HCL 50 MG/ML IJ SOLN: 12.5000 mg | Freq: Four times a day (QID) | INTRAMUSCULAR | Status: DC | PRN

## 2023-12-06 MED ORDER — ONDANSETRON HCL 4 MG/2ML IJ SOLN
4.0000 mg | Freq: Four times a day (QID) | INTRAMUSCULAR | Status: DC | PRN
  Administered 2023-12-07: 4 mg via INTRAVENOUS
  Filled 2023-12-06: qty 2

## 2023-12-06 MED ORDER — ACETAMINOPHEN 500 MG PO TABS
1000.0000 mg | ORAL_TABLET | Freq: Four times a day (QID) | ORAL | Status: DC
  Administered 2023-12-06 – 2023-12-09 (×7): 1000 mg via ORAL
  Filled 2023-12-06 (×9): qty 2

## 2023-12-06 MED ORDER — ENOXAPARIN SODIUM 40 MG/0.4ML IJ SOSY: 40.0000 mg | PREFILLED_SYRINGE | INTRAMUSCULAR | Status: DC

## 2023-12-06 MED ORDER — HYDROMORPHONE HCL 1 MG/ML IJ SOLN
1.0000 mg | Freq: Once | INTRAMUSCULAR | Status: AC
  Administered 2023-12-06: 1 mg via INTRAVENOUS
  Filled 2023-12-06: qty 1

## 2023-12-06 MED ORDER — MORPHINE SULFATE (PF) 4 MG/ML IV SOLN
4.0000 mg | Freq: Once | INTRAVENOUS | Status: AC
  Administered 2023-12-06: 4 mg via INTRAVENOUS
  Filled 2023-12-06: qty 1

## 2023-12-06 MED ORDER — AMLODIPINE BESYLATE 5 MG PO TABS
5.0000 mg | ORAL_TABLET | Freq: Every day | ORAL | Status: DC
  Administered 2023-12-07 – 2023-12-08 (×2): 5 mg via ORAL
  Filled 2023-12-06 (×2): qty 1

## 2023-12-06 NOTE — ED Notes (Signed)
 Pt was seen at the [facility] on November 24, 2023 for N/V/D and abdominal pain. Pt was treated with pain medication, hydration, - pregnancy, and negative respiratory panel. Pt states after leaving hospital N/V/D resolved but abdomin is hurting. When pt gets intermittent pain it feels like contractions and 10/10. Pt denies injuring but haven't been the same since 7/26.

## 2023-12-06 NOTE — ED Provider Notes (Signed)
 4:08 PM Patient reassessed.  Pending CT scan.  Reports that she continues to have abdominal pain, but it has improved since morphine .  Declines additional medications.  4:27 PM Patient with persistent pain. Additional medications ordered.  6:09 PM Transported to CT  6:38 PM Radiology called with report for abdominopelvic CT. Appears patient has 1.5cm dilated appendix with associated 5.5cm phlegmon. There is mass affect to sigmoid with wall thickening likely secondary to adjacent inflammatory process.   IV Zosyn  ordered. Page placed to [facility] for consultation, admission.  6:51 PM Dr. Tanda of [facility] to evaluation patient in the ED.   Results for orders placed or performed during the hospital encounter of 12/06/23  CBC with Differential   Collection Time: 12/06/23  1:51 PM  Result Value Ref Range   WBC 12.9 (H) 4.0 - 10.5 K/uL   RBC 4.06 3.87 - 5.11 MIL/uL   Hemoglobin 11.4 (L) 12.0 - 15.0 g/dL   HCT 64.6 (L) 63.9 - 53.9 %   MCV 86.9 80.0 - 100.0 fL   MCH 28.1 26.0 - 34.0 pg   MCHC 32.3 30.0 - 36.0 g/dL   RDW 86.2 88.4 - 84.4 %   Platelets 422 (H) 150 - 400 K/uL   nRBC 0.0 0.0 - 0.2 %   Neutrophils Relative % 73 %   Neutro Abs 9.5 (H) 1.7 - 7.7 K/uL   Lymphocytes Relative 12 %   Lymphs Abs 1.5 0.7 - 4.0 K/uL   Monocytes Relative 13 %   Monocytes Absolute 1.6 (H) 0.1 - 1.0 K/uL   Eosinophils Relative 1 %   Eosinophils Absolute 0.1 0.0 - 0.5 K/uL   Basophils Relative 1 %   Basophils Absolute 0.1 0.0 - 0.1 K/uL   Immature Granulocytes 0 %   Abs Immature Granulocytes 0.05 0.00 - 0.07 K/uL  Comprehensive metabolic panel   Collection Time: 12/06/23  1:51 PM  Result Value Ref Range   Sodium 138 135 - 145 mmol/L   Potassium 4.9 3.5 - 5.1 mmol/L   Chloride 98 98 - 111 mmol/L   CO2 25 22 - 32 mmol/L   Glucose, Bld 92 70 - 99 mg/dL   BUN 6 6 - 20 mg/dL   Creatinine, Ser 9.14 0.44 - 1.00 mg/dL   Calcium 9.7 8.9 - 89.6 mg/dL   Total Protein 8.1 6.5 - 8.1 g/dL   Albumin 3.2 (L) 3.5  - 5.0 g/dL   AST 52 (H) 15 - 41 U/L   ALT 62 (H) 0 - 44 U/L   Alkaline Phosphatase 104 38 - 126 U/L   Total Bilirubin 0.4 0.0 - 1.2 mg/dL   GFR, Estimated >39 >39 mL/min   Anion gap 15 5 - 15  Lipase, blood   Collection Time: 12/06/23  1:51 PM  Result Value Ref Range   Lipase 33 11 - 51 U/L  Urinalysis, Routine w reflex microscopic -Urine, Clean Catch   Collection Time: 12/06/23  3:42 PM  Result Value Ref Range   Color, Urine AMBER (A) YELLOW   APPearance HAZY (A) CLEAR   Specific Gravity, Urine 1.017 1.005 - 1.030   pH 5.0 5.0 - 8.0   Glucose, UA NEGATIVE NEGATIVE mg/dL   Hgb urine dipstick NEGATIVE NEGATIVE   Bilirubin Urine NEGATIVE NEGATIVE   Ketones, ur NEGATIVE NEGATIVE mg/dL   Protein, ur NEGATIVE NEGATIVE mg/dL   Nitrite NEGATIVE NEGATIVE   Leukocytes,Ua NEGATIVE NEGATIVE  hCG, serum, qualitative   Collection Time: 12/06/23  3:48 PM  Result Value Ref  Range   Preg, Serum NEGATIVE NEGATIVE  I-Stat CG4 Lactic Acid   Collection Time: 12/06/23  4:08 PM  Result Value Ref Range   Lactic Acid, Venous 1.0 0.5 - 1.9 mmol/L   CT ABDOMEN PELVIS W CONTRAST Result Date: 12/06/2023 CLINICAL DATA:  Lower abdominal and lower back pain for 2 weeks EXAM: CT ABDOMEN AND PELVIS WITH CONTRAST TECHNIQUE: Multidetector CT imaging of the abdomen and pelvis was performed using the standard protocol following bolus administration of intravenous contrast. RADIATION DOSE REDUCTION: This exam was performed according to the departmental dose-optimization program which includes automated exposure control, adjustment of the mA and/or kV according to patient size and/or use of iterative reconstruction technique. CONTRAST:  75mL OMNIPAQUE  IOHEXOL  350 MG/ML SOLN COMPARISON:  None Available. FINDINGS: Lower chest: No acute pleural or parenchymal lung disease. Hepatobiliary: Mild intrahepatic biliary duct dilation, which may be related to the patient's prior cholecystectomy. No focal liver abnormalities.  Pancreas: Unremarkable. No pancreatic ductal dilatation or surrounding inflammatory changes. Spleen: Normal in size without focal abnormality. Adrenals/Urinary Tract: Kidneys enhance normally and symmetrically. No urinary tract calculi or obstructive uropathy within either kidney. The adrenals and bladder are unremarkable. Stomach/Bowel: Postsurgical changes from prior gastric bypass surgery. No bowel obstruction or ileus. There is a dilated inflamed appendix in the right lower quadrant, measuring up to 1.5 cm in diameter reference image 69/6. At the distal tip of the appendix, reference image 74/6 and image 63/3, there is a 5.6 x 5.6 x 5.0 cm inflammatory process most consistent with ruptured appendicitis and developing phlegmon. This phlegmon has mass effect upon the adjacent sigmoid colon, with secondary sigmoid colon wall thickening. Vascular/Lymphatic: Subcentimeter mesenteric lymph nodes are likely reactive. No pathologic adenopathy. No significant vascular findings. Reproductive: Uterus and bilateral adnexa are unremarkable. IUD within the endometrial cavity. Other: Trace pelvic free fluid, with prominent mesenteric fat stranding surrounding the inflamed appendix and associated phlegmon described above. No free intraperitoneal gas. No abdominal wall hernia. Musculoskeletal: No acute or destructive bony abnormalities. Reconstructed images demonstrate no additional findings. IMPRESSION: 1. Acute appendicitis, with 5.6 cm phlegmon adjacent to the appendiceal tip. 2. Wall thickening of the sigmoid colon, likely secondary inflammatory change due to the complicated appendicitis and pelvic phlegmon described above. 3. Trace pelvic free fluid. 4. Postsurgical changes from bariatric surgery and prior cholecystectomy. These results were called by telephone at the time of interpretation on 12/06/2023 at 6:38 pm to provider [facility], who verbally acknowledged these results. Electronically Signed   By: Ozell Daring  M.D.   On: 12/06/2023 18:42      Keith Sor, PA-C 12/06/23 CLAIR    Cottie Donnice PARAS, MD 12/06/23 [phone]

## 2023-12-06 NOTE — H&P (Addendum)
 CC: abdominal pain  Requesting provider: Dr Cottie  HPI: Gina Banks is an 48 y.o. 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 Mikell who states that she started having abdominal pain about 2 weeks ago while visiting her parents down in [place].  She states that the pain was in her upper abdomen mid abdomen and lower abdomen.  It was associate with nausea vomiting and diarrhea.  She went to the ER at [facility] and underwent some basic labs and no imaging and was diagnosed with enteritis and was given some IV fluids and a prescription for Bentyl.  She states that she continued to have persistent intermittent abdominal discomfort and then gradually became mainly focused in the lower abdomen.  It would radiate to her side.  She states that she noticed that her appetite went down.  No dysuria or hematuria.  She would have some trouble starting her urine stream if she were to bear down which would cause abdominal pain.  No melena or hematochezia.  Does not take NSAIDs.  Her nausea and vomiting and diarrhea improved but her abdominal pain persisted so [facility].  Came to the emergency room for further evaluation because of persistent abdominal pain.  She is takes her multivitamin daily but no calcium supplementation.  She has had a cholecystectomy.  She takes medicine for blood pressure.  No blood thinners.  No tobacco or drug use.  Occasional alcohol consumption but very infrequent.  Past Medical History:  Diagnosis Date   Cyst of breast    Gastric bypass status for obesity    Herpes    Hx of cholecystectomy    Hypertension    on meds now    Past Surgical History:  Procedure Laterality Date   BREAST CYST EXCISION     BREAST EXCISIONAL BIOPSY Right 1992   CHOLECYSTECTOMY     CYST REMOVAL NECK Right    DILATION AND CURETTAGE OF UTERUS N/A  01/28/2014   Procedure: DILATATION AND CURETTAGE SUCTION WITH CHROMOSOME STUDIES;  Surgeon: Olam Mill, MD;  Location: [facility];  Service: Gynecology;  Laterality: N/A;   Fallopian Tube Cyst     GASTRIC BYPASS  03/2006    Family History  Problem Relation Age of Onset   Diabetes Father    Hypertension Father    Cancer Mother    Heart attack Paternal Grandmother     Social:  reports that she has never smoked. She has never used smokeless tobacco. She reports current alcohol use. She reports that she does not use drugs.  Allergies: No Known Allergies  Medications: I have reviewed the patient's current medications.   ROS - all of the below systems have been reviewed with the patient and positives are indicated with bold text General: chills, fever or night sweats Eyes: blurry vision or double vision ENT: epistaxis or sore throat Allergy/Immunology: itchy/watery eyes or nasal congestion Hematologic/Lymphatic: bleeding problems, blood clots or swollen lymph nodes Endocrine: temperature intolerance or unexpected weight changes Breast: new or changing breast lumps or nipple discharge Resp: cough, shortness of breath, or wheezing CV: chest pain or dyspnea on exertion GI: as per HPI GU: dysuria, trouble voiding, or hematuria MSK: joint pain or joint stiffness Neuro: TIA or stroke symptoms Derm: pruritus and skin lesion changes Psych: anxiety and depression  PE Blood pressure 114/72, pulse 100, temperature 100 F (37.8 C), temperature source Oral,  resp. rate (!) 22, height 5' 7 (1.702 m), weight 125.6 kg, SpO2 100%. Constitutional: NAD; conversant; no deformities; nontoxic; resting comfortably, asking for crackers Eyes: Moist conjunctiva; no lid lag; anicteric; PERRL Neck: Trachea midline; no thyromegaly Lungs: Normal respiratory effort; no tactile fremitus CV: RRR; no palpable thrills; no pitting edema GI: Abd soft, obese, TTP lower midline-not rigid, no peritonitis; no  palpable hepatosplenomegaly MSK:  no clubbing/cyanosis Psychiatric: Appropriate affect; alert and oriented x3 Lymphatic: No palpable cervical or axillary lymphadenopathy Skin:no rash/lesions/jaundice  Results for orders placed or performed during the hospital encounter of 12/06/23 (from the past 48 hours)  CBC with Differential     Status: Abnormal   Collection Time: 12/06/23  1:51 PM  Result Value Ref Range   WBC 12.9 (H) 4.0 - 10.5 K/uL   RBC 4.06 3.87 - 5.11 MIL/uL   Hemoglobin 11.4 (L) 12.0 - 15.0 g/dL   HCT 64.6 (L) 63.9 - 53.9 %   MCV 86.9 80.0 - 100.0 fL   MCH 28.1 26.0 - 34.0 pg   MCHC 32.3 30.0 - 36.0 g/dL   RDW 86.2 88.4 - 84.4 %   Platelets 422 (H) 150 - 400 K/uL   nRBC 0.0 0.0 - 0.2 %   Neutrophils Relative % 73 %   Neutro Abs 9.5 (H) 1.7 - 7.7 K/uL   Lymphocytes Relative 12 %   Lymphs Abs 1.5 0.7 - 4.0 K/uL   Monocytes Relative 13 %   Monocytes Absolute 1.6 (H) 0.1 - 1.0 K/uL   Eosinophils Relative 1 %   Eosinophils Absolute 0.1 0.0 - 0.5 K/uL   Basophils Relative 1 %   Basophils Absolute 0.1 0.0 - 0.1 K/uL   Immature Granulocytes 0 %   Abs Immature Granulocytes 0.05 0.00 - 0.07 K/uL    Comment: Performed at [facility], [address] [place], [place] [address]  Comprehensive metabolic panel     Status: Abnormal   Collection Time: 12/06/23  1:51 PM  Result Value Ref Range   Sodium 138 135 - 145 mmol/L   Potassium 4.9 3.5 - 5.1 mmol/L   Chloride 98 98 - 111 mmol/L   CO2 25 22 - 32 mmol/L   Glucose, Bld 92 70 - 99 mg/dL    Comment: Glucose reference range applies only to samples taken after fasting for at least 8 hours.   BUN 6 6 - 20 mg/dL   Creatinine, Ser 9.14 0.44 - 1.00 mg/dL   Calcium 9.7 8.9 - 89.6 mg/dL   Total Protein 8.1 6.5 - 8.1 g/dL   Albumin 3.2 (L) 3.5 - 5.0 g/dL   AST 52 (H) 15 - 41 U/L   ALT 62 (H) 0 - 44 U/L   Alkaline Phosphatase 104 38 - 126 U/L   Total Bilirubin 0.4 0.0 - 1.2 mg/dL   GFR, Estimated >39 >39 mL/min    Comment:  (NOTE) Calculated using the CKD-EPI Creatinine Equation (2021)    Anion gap 15 5 - 15    Comment: Performed at [facility], [address] [place], [place] [address]  Lipase, blood     Status: None   Collection Time: 12/06/23  1:51 PM  Result Value Ref Range   Lipase 33 11 - 51 U/L    Comment: Performed at [facility], [address], [place], [place] [address]  Urinalysis, Routine w reflex microscopic -Urine, Clean Catch     Status: Abnormal   Collection Time: 12/06/23  3:42  PM  Result Value Ref Range   Color, Urine AMBER (A) YELLOW    Comment: BIOCHEMICALS MAY BE AFFECTED BY COLOR   APPearance HAZY (A) CLEAR   Specific Gravity, Urine 1.017 1.005 - 1.030   pH 5.0 5.0 - 8.0   Glucose, UA NEGATIVE NEGATIVE mg/dL   Hgb urine dipstick NEGATIVE NEGATIVE   Bilirubin Urine NEGATIVE NEGATIVE   Ketones, ur NEGATIVE NEGATIVE mg/dL   Protein, ur NEGATIVE NEGATIVE mg/dL   Nitrite NEGATIVE NEGATIVE   Leukocytes,Ua NEGATIVE NEGATIVE    Comment: Performed at [facility], [address], [place], [place] [address]  hCG, serum, qualitative     Status: None   Collection Time: 12/06/23  3:48 PM  Result Value Ref Range   Preg, Serum NEGATIVE NEGATIVE    Comment:        THE SENSITIVITY OF THIS METHODOLOGY IS >10 mIU/mL. Performed at [facility], [address] [place], [place] [address]   I-Stat CG4 Lactic Acid     Status: None   Collection Time: 12/06/23  4:08 PM  Result Value Ref Range   Lactic Acid, Venous 1.0 0.5 - 1.9 mmol/L    CT ABDOMEN PELVIS W CONTRAST Result Date: 12/06/2023 CLINICAL DATA:  Lower abdominal and lower back pain for 2 weeks EXAM: CT ABDOMEN AND PELVIS WITH CONTRAST TECHNIQUE: Multidetector CT imaging of the abdomen and pelvis was performed using the standard protocol following bolus administration of intravenous contrast. RADIATION DOSE REDUCTION: This exam was performed according to the departmental dose-optimization program which includes  automated exposure control, adjustment of the mA and/or kV according to patient size and/or use of iterative reconstruction technique. CONTRAST:  75mL OMNIPAQUE  IOHEXOL  350 MG/ML SOLN COMPARISON:  None Available. FINDINGS: Lower chest: No acute pleural or parenchymal lung disease. Hepatobiliary: Mild intrahepatic biliary duct dilation, which may be related to the patient's prior cholecystectomy. No focal liver abnormalities. Pancreas: Unremarkable. No pancreatic ductal dilatation or surrounding inflammatory changes. Spleen: Normal in size without focal abnormality. Adrenals/Urinary Tract: Kidneys enhance normally and symmetrically. No urinary tract calculi or obstructive uropathy within either kidney. The adrenals and bladder are unremarkable. Stomach/Bowel: Postsurgical changes from prior gastric bypass surgery. No bowel obstruction or ileus. There is a dilated inflamed appendix in the right lower quadrant, measuring up to 1.5 cm in diameter reference image 69/6. At the distal tip of the appendix, reference image 74/6 and image 63/3, there is a 5.6 x 5.6 x 5.0 cm inflammatory process most consistent with ruptured appendicitis and developing phlegmon. This phlegmon has mass effect upon the adjacent sigmoid colon, with secondary sigmoid colon wall thickening. Vascular/Lymphatic: Subcentimeter mesenteric lymph nodes are likely reactive. No pathologic adenopathy. No significant vascular findings. Reproductive: Uterus and bilateral adnexa are unremarkable. IUD within the endometrial cavity. Other: Trace pelvic free fluid, with prominent mesenteric fat stranding surrounding the inflamed appendix and associated phlegmon described above. No free intraperitoneal gas. No abdominal wall hernia. Musculoskeletal: No acute or destructive bony abnormalities. Reconstructed images demonstrate no additional findings. IMPRESSION: 1. Acute appendicitis, with 5.6 cm phlegmon adjacent to the appendiceal tip. 2. Wall thickening of the  sigmoid colon, likely secondary inflammatory change due to the complicated appendicitis and pelvic phlegmon described above. 3. Trace pelvic free fluid. 4. Postsurgical changes from bariatric surgery and prior cholecystectomy. These results were called by telephone at the time of interpretation on 12/06/2023 at 6:38 pm to provider [facility], who verbally acknowledged these results. Electronically Signed   By: Ozell Daring  M.D.   On: 12/06/2023 18:42    Imaging: Personally reviewed  A/P: Suzen Janine Peng is an 48 y.o. female   acute appendicitis with phlegmon History of laparoscopic Roux-en-Y gastric bypass Hypertension Severe obesity Mild transaminitis  Admit IV fluids IV antibiotic We will give a dose or 2 of IV thiamine  since patient has been vomiting and has a history of gastric bypass Multivitamin Clears tonight and n.p.o. except meds after midnight Chemical VTE prophylaxis Repeat cmet in am  Patient is nontoxic-appearing.  She is resting comfortably.  She does not have peritonitis.  Given the associated phlegmon next to the appendix I would not recommend operative intervention as the first-line.  I think a straightforward laparoscopic appendectomy would be technically challenging based on the CT appearance.  I recommend starting with nonoperative management consisting of IV antibiotics and having IR evaluate for potential percutaneous drainage catheter placement.  We discussed typical treatment for ruptured appendicitis with associated phlegmon.  We discussed potential need for operative intervention this admission if she does not improve and/or fails to progress.  Data reviewed: ED note and labs at [facility] July 26, ED notes here, labs today, vital signs today, CT imaging personally reviewed; urgent care office note August 5; gastric bypass op note from 2007  High-level medical decision making  Camellia HERO. Tanda, MD, FACS General, Bariatric, & Minimally Invasive  Surgery [facility]

## 2023-12-06 NOTE — ED Notes (Signed)
 Pt states her abdomen appears bigger than normal. She notice her pants aren't fitting normal. Pt noted that her eyes seem to appear glossy as if she is about to cry. Pt denies burning.  Pt states she notice at her part time job vision was blurry. Pt denies weakness, numbness, and/or tingling on side of body.

## 2023-12-06 NOTE — ED Triage Notes (Signed)
 Pt C/O lower abd pain and lower back pain for 2 weeks. Denies n/v/d/ CP.

## 2023-12-06 NOTE — ED Provider Notes (Signed)
 [place] EMERGENCY DEPARTMENT AT [facility] Provider Note   CSN: [id number] Arrival date & time: 12/06/23  1254     Patient presents with: Abdominal Pain   Gina Banks is a 48 y.o. female.   The history is provided by the patient and medical records. No language interpreter was used.  Abdominal Pain    48 year old female with history of of chronic pelvic pain, gastric bypass, hypertension, prior surgical history including cholecystectomy who presents with complaint of abdominal pain.  Patient reports 2 weeks ago she had a viral stomach bug with complaint of abdominal pain nausea vomiting diarrhea.  That lasted for several days and she was evaluated in the hospital was given medication for that and subsequently discharged home.  She mentioned that the vomiting and diarrhea has resolved however she still have lingering abdominal pain.  Pain is primarily to her lower abdomen, waxing and waning, nonradiating, and happen sporadically.  When the pain came is fairly intense and sharp.  There are no associated fever or chills no chest pain or shortness of breath no nausea vomiting or diarrhea no dysuria no hematuria vaginal bleeding or vaginal discharge.  She has an IUD.  Her last menstrual period was a year ago.  No new sexual partner.  At home she is taking medication that was previously prescribed but her symptoms still lingering.  She mentioned that her abdomen seems to be more distended than normal.  Prior to Admission medications   Medication Sig Start Date End Date Taking? Authorizing Provider  amLODipine  (NORVASC ) 5 MG tablet TAKE 1 TABLET BY MOUTH EVERY DAY 05/05/23   Fredirick Glenys RAMAN, MD  carvedilol  (COREG ) 25 MG tablet TAKE 1 TABLET (25 MG TOTAL) BY MOUTH TWICE A DAY WITH MEALS 08/04/23   Rudy Carlin LABOR, MD  levonorgestrel  (MIRENA ) 20 MCG/24HR IUD 1 each by Intrauterine route once.    [provider]    Allergies: Patient has no known allergies.    Review  of Systems  Gastrointestinal:  Positive for abdominal pain.  All other systems reviewed and are negative.   Updated Vital Signs BP 124/88 (BP Location: Right Arm)   Pulse (!) 108   Temp 99.1 F (37.3 C)   Resp 16   Ht 5' 7 (1.702 m)   Wt 125.6 kg   SpO2 99%   BMI 43.38 kg/m   Physical Exam Vitals and nursing note reviewed.  Constitutional:      General: She is not in acute distress.    Appearance: She is well-developed. She is obese.  HENT:     Head: Atraumatic.  Eyes:     Conjunctiva/sclera: Conjunctivae normal.  Cardiovascular:     Rate and Rhythm: Normal rate and regular rhythm.  Pulmonary:     Effort: Pulmonary effort is normal.  Abdominal:     General: Bowel sounds are normal.     Palpations: Abdomen is soft.     Tenderness: There is abdominal tenderness in the suprapubic area. There is no right CVA tenderness or left CVA tenderness.  Musculoskeletal:     Cervical back: Neck supple.  Skin:    Findings: No rash.  Neurological:     Mental Status: She is alert.  Psychiatric:        Mood and Affect: Mood normal.     (all labs ordered are listed, but only abnormal results are displayed) Labs Reviewed  CBC WITH DIFFERENTIAL/PLATELET - Abnormal; Notable for the following components:  Result Value   WBC 12.9 (*)    Hemoglobin 11.4 (*)    HCT 35.3 (*)    Platelets 422 (*)    Neutro Abs 9.5 (*)    Monocytes Absolute 1.6 (*)    All other components within normal limits  COMPREHENSIVE METABOLIC PANEL WITH GFR - Abnormal; Notable for the following components:   Albumin 3.2 (*)    AST 52 (*)    ALT 62 (*)    All other components within normal limits  LIPASE, BLOOD  URINALYSIS, ROUTINE W REFLEX MICROSCOPIC  HCG, SERUM, QUALITATIVE  I-STAT CG4 LACTIC ACID, ED    EKG: None  Radiology: No results found.   Procedures   Medications Ordered in the ED  morphine  (PF) 4 MG/ML injection 4 mg (4 mg Intravenous Given 12/06/23 1516)                                     Medical Decision Making Amount and/or Complexity of Data Reviewed Labs: ordered. Radiology: ordered.  Risk Prescription drug management.   BP 124/88 (BP Location: Right Arm)   Pulse (!) 108   Temp 99.1 F (37.3 C)   Resp 16   Ht 5' 7 (1.702 m)   Wt 125.6 kg   SpO2 99%   BMI 43.38 kg/m   58:92 PM  48 year old female with history of of chronic pelvic pain, gastric bypass, hypertension, prior surgical history including cholecystectomy who presents with complaint of abdominal pain.  Patient reports 2 weeks ago she had a viral stomach bug with complaint of abdominal pain nausea vomiting diarrhea.  That lasted for several days and she was evaluated in the hospital was given medication for that and subsequently discharged home.  She mentioned that the vomiting and diarrhea has resolved however she still have lingering abdominal pain.  Pain is primarily to her lower abdomen, waxing and waning, nonradiating, and happen sporadically.  When the pain came is fairly intense and sharp.  There are no associated fever or chills no chest pain or shortness of breath no nausea vomiting or diarrhea no dysuria no hematuria vaginal bleeding or vaginal discharge.  She has an IUD.  Her last menstrual period was a year ago.  No new sexual partner.  At home she is taking medication that was previously prescribed but her symptoms still lingering.  She mentioned that her abdomen seems to be more distended than normal.  On exam patient is resting comfortably in no acute discomfort.  She has some mild suprapubic tenderness but without guarding rebound tenderness.  Bowel sounds are present.  No CVA tenderness.  Vital sign notable for tachycardia with heart rate of 108.  She is afebrile no hypoxia.  I offered patient pain medication but patient declined.  Given the recurrent abdominal pain and patient's concern, will obtain abdominal pelvis CT scan for further assessment.  -Labs ordered, independently viewed  and interpreted by me.  Labs remarkable for WBC 12.9 -The patient was maintained on a cardiac monitor.  I personally viewed and interpreted the cardiac monitored which showed an underlying rhythm of: sinus tach -Imaging including abd/pelvis CT pending -This patient presents to the ED for concern of abd pain, this involves an extensive number of treatment options, and is a complaint that carries with it a high risk of complications and morbidity.  The differential diagnosis includes apendicitis, colitis, diverticulitis, pancreatitis, cholecystitis, PID, UTI -Co morbidities that  complicate the patient evaluation includes none -Treatment includes morphine , zofran , IVF -Reevaluation of the patient after these medicines showed that the patient improved -PCP office notes or outside notes reviewed -Discussion with oncoming provider who will f/u on CT result and determine dispo -Escalation to admission/observation considered: dispo pending       Final diagnoses:  None    ED Discharge Orders     None          Nivia Colon, PA-C 12/06/23 1555    Yolande Lamar BROCKS, MD 12/07/23 1000

## 2023-12-06 NOTE — ED Notes (Signed)
 Pt requesting snack and drink. EDP notified

## 2023-12-07 LAB — COMPREHENSIVE METABOLIC PANEL WITH GFR
ALT: 161 U/L — ABNORMAL HIGH (ref 0–44)
AST: 145 U/L — ABNORMAL HIGH (ref 15–41)
Albumin: 2.8 g/dL — ABNORMAL LOW (ref 3.5–5.0)
Alkaline Phosphatase: 157 U/L — ABNORMAL HIGH (ref 38–126)
Anion gap: 11 (ref 5–15)
BUN: 5 mg/dL — ABNORMAL LOW (ref 6–20)
CO2: 25 mmol/L (ref 22–32)
Calcium: 9.3 mg/dL (ref 8.9–10.3)
Chloride: 100 mmol/L (ref 98–111)
Creatinine, Ser: 0.89 mg/dL (ref 0.44–1.00)
GFR, Estimated: >60 mL/min (ref >60)
Glucose, Bld: 103 mg/dL — ABNORMAL HIGH (ref 70–99)
Potassium: 4 mmol/L (ref 3.5–5.1)
Sodium: 136 mmol/L (ref 135–145)
Total Bilirubin: 0.9 mg/dL (ref 0.0–1.2)
Total Protein: 7.5 g/dL (ref 6.5–8.1)

## 2023-12-07 LAB — CBC
HCT: 33 % — ABNORMAL LOW (ref 36.0–46.0)
Hemoglobin: 10.6 g/dL — ABNORMAL LOW (ref 12.0–15.0)
MCH: 27.4 pg (ref 26.0–34.0)
MCHC: 32.1 g/dL (ref 30.0–36.0)
MCV: 85.3 fL (ref 80.0–100.0)
Platelets: 390 K/uL (ref 150–400)
RBC: 3.87 MIL/uL (ref 3.87–5.11)
RDW: 13.7 % (ref 11.5–15.5)
WBC: 12.5 K/uL — ABNORMAL HIGH (ref 4.0–10.5)
nRBC: 0 % (ref 0.0–0.2)

## 2023-12-07 LAB — PROTIME-INR
INR: 1.2 (ref 0.8–1.2)
Prothrombin Time: 15.7 s — ABNORMAL HIGH (ref 11.4–15.2)

## 2023-12-07 LAB — MAGNESIUM: Magnesium: 1.8 mg/dL (ref 1.7–2.4)

## 2023-12-07 LAB — HIV ANTIBODY (ROUTINE TESTING W REFLEX): HIV Screen 4th Generation wRfx: NONREACTIVE

## 2023-12-07 LAB — APTT: aPTT: 32 s (ref 24–36)

## 2023-12-07 MED ORDER — CARMEX CLASSIC LIP BALM EX OINT
1.0000 | TOPICAL_OINTMENT | CUTANEOUS | Status: DC | PRN
  Filled 2023-12-07: qty 10

## 2023-12-07 MED ORDER — ENOXAPARIN SODIUM 40 MG/0.4ML IJ SOSY
40.0000 mg | PREFILLED_SYRINGE | INTRAMUSCULAR | Status: DC
  Administered 2023-12-07 – 2023-12-08 (×2): 40 mg via SUBCUTANEOUS
  Filled 2023-12-07 (×2): qty 0.4

## 2023-12-07 NOTE — Progress Notes (Signed)
 IR contacted by [facility] Surgery team to review case for possible intraabdominal abscess aspiration and possible drain placement.   Dr. Vanice reviewed imaging and notes; no safe window for intervention at this time. Fluid collection appearance favors phlegmon over abscess.   Communicated this with surgery PA. If patient is not taken to OR, can repeat CT in 48hr and place IR eval again to assess whether collection amenable to IR intervention at that time.   Electronically Signed: Laymon Coast, NP 12/07/2023, 2:08 PM

## 2023-12-07 NOTE — TOC Initial Note (Signed)
 Transition of Care [facility]) - Initial/Assessment Note    Patient Details  Name: Gina Banks MRN: [id number] Date of Birth: 1976-03-06  Transition of Care [facility]) CM/SW Contact:    Jeoffrey LITTIE Moose, LCSW Phone Number: 12/07/2023, 9:16 AM  Clinical Narrative:                 Pt admitted from home due to lower abdominal pain. No current TOC needs please consult as needs arise.         Patient Goals and CMS Choice            Expected Discharge Plan and Services                                              Prior Living Arrangements/Services                       Activities of Daily Living      Permission Sought/Granted                  Emotional Assessment              Admission diagnosis:  Acute perforated appendicitis [K35.32] Acute appendicitis, unspecified acute appendicitis type [K35.80] Patient Active Problem List   Diagnosis Date Noted   Acute perforated appendicitis 12/06/2023   Chronic pelvic pain in female 02/20/2023   Prolonged menstrual cycle 02/20/2023   Heterogeneously dense tissue of both breasts on mammography 02/20/2023   Other fatigue 02/20/2023   Obesity, Class III, BMI 40-49.9 (morbid obesity) 04/28/2019   Vitamin D  deficiency 06/07/2013   PCP:  Rudy Carlin LABOR, MD Pharmacy:   [phone] [facility] - [place], [place] - [address] [place] [address] Phone: [phone] Fax: [phone]     Social Drivers of Health (SDOH) Social History: SDOH Screenings   Food Insecurity: No Food Insecurity (12/06/2023)  Housing: Unknown (12/06/2023)  Transportation Needs: No Transportation Needs (12/06/2023)  Utilities: Not At Risk (12/06/2023)  Depression (PHQ2-9): Low Risk  (02/20/2023)  Tobacco Use: Low Risk  (12/06/2023)   SDOH Interventions:     Readmission Risk Interventions     No data to display

## 2023-12-07 NOTE — Progress Notes (Signed)
   12/07/23 1400  Spiritual Encounters  Type of Visit Initial  Care provided to: Patient  Referral source Patient request  Reason for visit Advance directives  OnCall Visit No  Interventions  Spiritual Care Interventions Made Established relationship of care and support;Compassionate presence;Reflective listening;Encouragement  Intervention Outcomes  Outcomes Autonomy/agency;Awareness of support  Spiritual Care Plan  Spiritual Care Issues Still Outstanding No further spiritual care needs at this time (see row info)   Chaplain responded to spiritual consult for Advanced Directive.  Educated patient on documents and informed her how to contact the Spiritual Care Department when ready to proceed. No further questions or concerns at this time.  Chaplain spiritual support services remain available as the need arises.

## 2023-12-07 NOTE — Plan of Care (Signed)
   Problem: Coping: Goal: Level of anxiety will decrease Outcome: Progressing   Problem: Pain Managment: Goal: General experience of comfort will improve and/or be controlled Outcome: Progressing

## 2023-12-07 NOTE — Progress Notes (Signed)
 Subjective: CC: She reports abdominal pain resolved. No n/v. Passing flatus. No BM.   Reports 10d of abdominal pain prior to presentation.   No hx of Crohn's or UC. No prior colonoscopy.   Hx of prior cholecystectomy and Roux-en-Y gastric bypass. Is on multivitamin daily. She is not on calcium supplementation.   She is not on blood thinners.   Fever and tachycardia resolved. No hypotension. WBC 12.5 from 12.9  Objective: Vital signs in last 24 hours: Temp:  [98.5 F (36.9 C)-100.8 F (38.2 C)] 98.8 F (37.1 C) (08/08 0520) Pulse Rate:  [87-108] 92 (08/08 0520) Resp:  [16-22] 18 (08/08 0520) BP: (100-135)/(48-92) 123/74 (08/08 0520) SpO2:  [99 %-100 %] 100 % (08/08 0520) Weight:  [125.6 kg] 125.6 kg (08/07 1306) Last BM Date : 12/06/23  Intake/Output from previous day: 08/07 0701 - 08/08 0700 In: 225 [P.O.:225] Out: -  Intake/Output this shift: No intake/output data recorded.  PE: Gen:  Alert, NAD, pleasant Card:  Reg Pulm: Rate and effort normal Abd: Soft, no distension, suprapubic and rlq ttp without rigidity or guarding. +BS Psych: A&Ox3    Lab Results:  Recent Labs    12/06/23 1351 12/07/23 0636  WBC 12.9* 12.5*  HGB 11.4* 10.6*  HCT 35.3* 33.0*  PLT 422* 390   BMET Recent Labs    12/06/23 1351 12/07/23 0636  NA 138 136  K 4.9 4.0  CL 98 100  CO2 25 25  GLUCOSE 92 103*  BUN 6 5*  CREATININE 0.85 0.89  CALCIUM 9.7 9.3   PT/INR Recent Labs    12/07/23 0636  LABPROT 15.7*  INR 1.2   CMP     Component Value Date/Time   NA 136 12/07/2023 0636   NA 140 12/08/2019 1646   K 4.0 12/07/2023 0636   CL 100 12/07/2023 0636   CO2 25 12/07/2023 0636   GLUCOSE 103 (H) 12/07/2023 0636   BUN 5 (L) 12/07/2023 0636   BUN 7 12/08/2019 1646   CREATININE 0.89 12/07/2023 0636   CALCIUM 9.3 12/07/2023 0636   PROT 7.5 12/07/2023 0636   PROT 7.1 12/08/2019 1646   ALBUMIN 2.8 (L) 12/07/2023 0636   ALBUMIN 4.0 12/08/2019 1646   AST 145 (H)  12/07/2023 0636   ALT 161 (H) 12/07/2023 0636   ALKPHOS 157 (H) 12/07/2023 0636   BILITOT 0.9 12/07/2023 0636   BILITOT 0.3 12/08/2019 1646   GFRNONAA >60 12/07/2023 0636   GFRAA 105 12/08/2019 1646   Lipase     Component Value Date/Time   LIPASE 33 12/06/2023 1351    Studies/Results: CT ABDOMEN PELVIS W CONTRAST Result Date: 12/06/2023 CLINICAL DATA:  Lower abdominal and lower back pain for 2 weeks EXAM: CT ABDOMEN AND PELVIS WITH CONTRAST TECHNIQUE: Multidetector CT imaging of the abdomen and pelvis was performed using the standard protocol following bolus administration of intravenous contrast. RADIATION DOSE REDUCTION: This exam was performed according to the departmental dose-optimization program which includes automated exposure control, adjustment of the mA and/or kV according to patient size and/or use of iterative reconstruction technique. CONTRAST:  75mL OMNIPAQUE  IOHEXOL  350 MG/ML SOLN COMPARISON:  None Available. FINDINGS: Lower chest: No acute pleural or parenchymal lung disease. Hepatobiliary: Mild intrahepatic biliary duct dilation, which may be related to the patient's prior cholecystectomy. No focal liver abnormalities. Pancreas: Unremarkable. No pancreatic ductal dilatation or surrounding inflammatory changes. Spleen: Normal in size without focal abnormality. Adrenals/Urinary Tract: Kidneys enhance normally and symmetrically. No urinary tract calculi  or obstructive uropathy within either kidney. The adrenals and bladder are unremarkable. Stomach/Bowel: Postsurgical changes from prior gastric bypass surgery. No bowel obstruction or ileus. There is a dilated inflamed appendix in the right lower quadrant, measuring up to 1.5 cm in diameter reference image 69/6. At the distal tip of the appendix, reference image 74/6 and image 63/3, there is a 5.6 x 5.6 x 5.0 cm inflammatory process most consistent with ruptured appendicitis and developing phlegmon. This phlegmon has mass effect upon the  adjacent sigmoid colon, with secondary sigmoid colon wall thickening. Vascular/Lymphatic: Subcentimeter mesenteric lymph nodes are likely reactive. No pathologic adenopathy. No significant vascular findings. Reproductive: Uterus and bilateral adnexa are unremarkable. IUD within the endometrial cavity. Other: Trace pelvic free fluid, with prominent mesenteric fat stranding surrounding the inflamed appendix and associated phlegmon described above. No free intraperitoneal gas. No abdominal wall hernia. Musculoskeletal: No acute or destructive bony abnormalities. Reconstructed images demonstrate no additional findings. IMPRESSION: 1. Acute appendicitis, with 5.6 cm phlegmon adjacent to the appendiceal tip. 2. Wall thickening of the sigmoid colon, likely secondary inflammatory change due to the complicated appendicitis and pelvic phlegmon described above. 3. Trace pelvic free fluid. 4. Postsurgical changes from bariatric surgery and prior cholecystectomy. These results were called by telephone at the time of interpretation on 12/06/2023 at 6:38 pm to provider [facility], who verbally acknowledged these results. Electronically Signed   By: Ozell Daring M.D.   On: 12/06/2023 18:42    Anti-infectives: Anti-infectives (From admission, onward)    Start     Dose/Rate Route Frequency Ordered Stop   12/07/23 0200  piperacillin -tazobactam (ZOSYN ) IVPB 3.375 g        3.375 g 12.5 mL/hr over 240 Minutes Intravenous Every 8 hours 12/06/23 2202 12/14/23 0559   12/06/23 1845  piperacillin -tazobactam (ZOSYN ) IVPB 3.375 g        3.375 g 100 mL/hr over 30 Minutes Intravenous  Once 12/06/23 1841 12/06/23 2023        Assessment/Plan Perforated appendicitis with abscess - CT w/ above with 5.6 cm phlegmon adjacent to the appendiceal tip - Will review with IR to see if IAA amenable to drainage.  - Cont abx - If IAA is not amenable to drainage, reasonable to observe for 24 hours given improvement of hemodynamics (fever  resolved, tachycardia resolved, no hypotension), lactic wnl, wbc slightly down at 12.5, she reports pain has resolved and she has no peritonitis on exam. If IAA is not amenable to drainage and she worsens at all, suspect we will proceed with OR during admission. - If improves with conservative management would recommend colonoscopy in 6 weeks followed by follow up in the office to discuss interval appendectomy.  - Restart home meds  FEN - NPO for IR eval, IVF at 75ml/hr. Multi.  VTE - SCDs, Lovenox  (hold this AM incase IR is able to proceed with drainage ID - Zosyn   I reviewed nursing notes, last 24 h vitals and pain scores, last 48 h intake and output, last 24 h labs and trends, and last 24 h imaging results.    LOS: 1 day    Ozell CHRISTELLA Shaper, [facility] Surgery 12/07/2023, 8:59 AM Please see Amion for pager number during day hours 7:00am-4:30pm

## 2023-12-08 ENCOUNTER — Other Ambulatory Visit: Payer: Self-pay | Admitting: Obstetrics

## 2023-12-08 LAB — HEPATIC FUNCTION PANEL
ALT: 141 U/L — ABNORMAL HIGH (ref 0–44)
AST: 84 U/L — ABNORMAL HIGH (ref 15–41)
Albumin: 2.8 g/dL — ABNORMAL LOW (ref 3.5–5.0)
Alkaline Phosphatase: 169 U/L — ABNORMAL HIGH (ref 38–126)
Bilirubin, Direct: 0.3 mg/dL — ABNORMAL HIGH (ref 0.0–0.2)
Indirect Bilirubin: 0.2 mg/dL — ABNORMAL LOW (ref 0.3–0.9)
Total Bilirubin: 0.5 mg/dL (ref 0.0–1.2)
Total Protein: 7.7 g/dL (ref 6.5–8.1)

## 2023-12-08 LAB — CBC
HCT: 31.6 % — ABNORMAL LOW (ref 36.0–46.0)
Hemoglobin: 10.1 g/dL — ABNORMAL LOW (ref 12.0–15.0)
MCH: 27.3 pg (ref 26.0–34.0)
MCHC: 32 g/dL (ref 30.0–36.0)
MCV: 85.4 fL (ref 80.0–100.0)
Platelets: 417 K/uL — ABNORMAL HIGH (ref 150–400)
RBC: 3.7 MIL/uL — ABNORMAL LOW (ref 3.87–5.11)
RDW: 13.7 % (ref 11.5–15.5)
WBC: 11.8 K/uL — ABNORMAL HIGH (ref 4.0–10.5)
nRBC: 0 % (ref 0.0–0.2)

## 2023-12-08 NOTE — Plan of Care (Signed)

## 2023-12-08 NOTE — Progress Notes (Signed)
 Subjective/Chief Complaint: Still has some pain but slowly improving. No fever. Tolerating clears   Objective: Vital signs in last 24 hours: Temp:  [98.3 F (36.8 C)-99.4 F (37.4 C)] 99.1 F (37.3 C) (08/09 0445) Pulse Rate:  [80-98] 89 (08/09 0445) Resp:  [16-18] 18 (08/09 0445) BP: (100-110)/(60-88) 110/88 (08/09 0445) SpO2:  [94 %-100 %] 94 % (08/09 0445) Last BM Date : 12/05/23  Intake/Output from previous day: 08/08 0701 - 08/09 0700 In: 1433.3 [P.O.:1140; I.V.:203.6; IV Piggyback:89.7] Out: -  Intake/Output this shift: No intake/output data recorded.  General appearance: alert and cooperative Resp: clear to auscultation bilaterally Cardio: regular rate and rhythm GI: soft, mild tenderness  Lab Results:  Recent Labs    12/07/23 0636 12/08/23 0722  WBC 12.5* 11.8*  HGB 10.6* 10.1*  HCT 33.0* 31.6*  PLT 390 417*   BMET Recent Labs    12/06/23 1351 12/07/23 0636  NA 138 136  K 4.9 4.0  CL 98 100  CO2 25 25  GLUCOSE 92 103*  BUN 6 5*  CREATININE 0.85 0.89  CALCIUM 9.7 9.3   PT/INR Recent Labs    12/07/23 0636  LABPROT 15.7*  INR 1.2   ABG No results for input(s): PHART, HCO3 in the last 72 hours.  Invalid input(s): PCO2, PO2  Studies/Results: CT ABDOMEN PELVIS W CONTRAST Result Date: 12/06/2023 CLINICAL DATA:  Lower abdominal and lower back pain for 2 weeks EXAM: CT ABDOMEN AND PELVIS WITH CONTRAST TECHNIQUE: Multidetector CT imaging of the abdomen and pelvis was performed using the standard protocol following bolus administration of intravenous contrast. RADIATION DOSE REDUCTION: This exam was performed according to the departmental dose-optimization program which includes automated exposure control, adjustment of the mA and/or kV according to patient size and/or use of iterative reconstruction technique. CONTRAST:  75mL OMNIPAQUE  IOHEXOL  350 MG/ML SOLN COMPARISON:  None Available. FINDINGS: Lower chest: No acute pleural or parenchymal  lung disease. Hepatobiliary: Mild intrahepatic biliary duct dilation, which may be related to the patient's prior cholecystectomy. No focal liver abnormalities. Pancreas: Unremarkable. No pancreatic ductal dilatation or surrounding inflammatory changes. Spleen: Normal in size without focal abnormality. Adrenals/Urinary Tract: Kidneys enhance normally and symmetrically. No urinary tract calculi or obstructive uropathy within either kidney. The adrenals and bladder are unremarkable. Stomach/Bowel: Postsurgical changes from prior gastric bypass surgery. No bowel obstruction or ileus. There is a dilated inflamed appendix in the right lower quadrant, measuring up to 1.5 cm in diameter reference image 69/6. At the distal tip of the appendix, reference image 74/6 and image 63/3, there is a 5.6 x 5.6 x 5.0 cm inflammatory process most consistent with ruptured appendicitis and developing phlegmon. This phlegmon has mass effect upon the adjacent sigmoid colon, with secondary sigmoid colon wall thickening. Vascular/Lymphatic: Subcentimeter mesenteric lymph nodes are likely reactive. No pathologic adenopathy. No significant vascular findings. Reproductive: Uterus and bilateral adnexa are unremarkable. IUD within the endometrial cavity. Other: Trace pelvic free fluid, with prominent mesenteric fat stranding surrounding the inflamed appendix and associated phlegmon described above. No free intraperitoneal gas. No abdominal wall hernia. Musculoskeletal: No acute or destructive bony abnormalities. Reconstructed images demonstrate no additional findings. IMPRESSION: 1. Acute appendicitis, with 5.6 cm phlegmon adjacent to the appendiceal tip. 2. Wall thickening of the sigmoid colon, likely secondary inflammatory change due to the complicated appendicitis and pelvic phlegmon described above. 3. Trace pelvic free fluid. 4. Postsurgical changes from bariatric surgery and prior cholecystectomy. These results were called by telephone at  the time of interpretation on  12/06/2023 at 6:38 pm to provider [facility], who verbally acknowledged these results. Electronically Signed   By: Ozell Daring M.D.   On: 12/06/2023 18:42    Anti-infectives: Anti-infectives (From admission, onward)    Start     Dose/Rate Route Frequency Ordered Stop   12/07/23 0200  piperacillin -tazobactam (ZOSYN ) IVPB 3.375 g        3.375 g 12.5 mL/hr over 240 Minutes Intravenous Every 8 hours 12/06/23 2202 12/14/23 0559   12/06/23 1845  piperacillin -tazobactam (ZOSYN ) IVPB 3.375 g        3.375 g 100 mL/hr over 30 Minutes Intravenous  Once 12/06/23 1841 12/06/23 2023       Assessment/Plan: s/p * No surgery found * Advance diet. Allow fulls today Continue IV abx until wbc normal Contained perforated appendicitis Continues to slowly improve  LOS: 2 days    Deward Null III 12/08/2023

## 2023-12-08 NOTE — Plan of Care (Signed)
  Problem: Education: Goal: Knowledge of General Education information will improve Description: Including pain rating scale, medication(s)/side effects and non-pharmacologic comfort measures Outcome: Progressing   Problem: Health Behavior/Discharge Planning: Goal: Ability to manage health-related needs will improve Outcome: Progressing   Problem: Clinical Measurements: Goal: Ability to maintain clinical measurements within normal limits will improve Outcome: Progressing Goal: Will remain free from infection Outcome: Progressing   Problem: Coping: Goal: Level of anxiety will decrease Outcome: Progressing   Problem: Pain Managment: Goal: General experience of comfort will improve and/or be controlled Outcome: Progressing

## 2023-12-09 MED ORDER — AMOXICILLIN-POT CLAVULANATE 875-125 MG PO TABS: 1.0000 | ORAL_TABLET | Freq: Two times a day (BID) | ORAL | Status: DC

## 2023-12-09 MED ORDER — OXYCODONE HCL 5 MG PO TABS: 5.0000 mg | ORAL_TABLET | Freq: Four times a day (QID) | ORAL | 0 refills | Status: DC | PRN

## 2023-12-09 MED ORDER — AMOXICILLIN-POT CLAVULANATE 875-125 MG PO TABS: 1.0000 | ORAL_TABLET | Freq: Two times a day (BID) | ORAL | 0 refills | Status: DC

## 2023-12-09 NOTE — Progress Notes (Signed)
   Subjective/Chief Complaint: No complaints   Objective: Vital signs in last 24 hours: Temp:  [97.9 F (36.6 C)-98.5 F (36.9 C)] 97.9 F (36.6 C) (08/10 0806) Pulse Rate:  [80-98] 80 (08/10 0806) Resp:  [18-20] 18 (08/10 0806) BP: (98-117)/(66-83) 117/79 (08/10 0806) SpO2:  [96 %-100 %] 99 % (08/10 0806) Last BM Date : 12/07/23  Intake/Output from previous day: 08/09 0701 - 08/10 0700 In: 960 [P.O.:960] Out: -  Intake/Output this shift: No intake/output data recorded.  General appearance: alert and cooperative Resp: clear to auscultation bilaterally Cardio: regular rate and rhythm GI: soft, nontender  Lab Results:  Recent Labs    12/07/23 0636 12/08/23 0722  WBC 12.5* 11.8*  HGB 10.6* 10.1*  HCT 33.0* 31.6*  PLT 390 417*   BMET Recent Labs    12/06/23 1351 12/07/23 0636  NA 138 136  K 4.9 4.0  CL 98 100  CO2 25 25  GLUCOSE 92 103*  BUN 6 5*  CREATININE 0.85 0.89  CALCIUM 9.7 9.3   PT/INR Recent Labs    12/07/23 0636  LABPROT 15.7*  INR 1.2   ABG No results for input(s): PHART, HCO3 in the last 72 hours.  Invalid input(s): PCO2, PO2  Studies/Results: No results found.  Anti-infectives: Anti-infectives (From admission, onward)    Start     Dose/Rate Route Frequency Ordered Stop   12/09/23 1000  amoxicillin -clavulanate (AUGMENTIN ) 875-125 MG per tablet 1 tablet        1 tablet Oral Every 12 hours 12/09/23 0823     12/07/23 0200  piperacillin -tazobactam (ZOSYN ) IVPB 3.375 g  Status:  Discontinued        3.375 g 12.5 mL/hr over 240 Minutes Intravenous Every 8 hours 12/06/23 2202 12/09/23 0823   12/06/23 1845  piperacillin -tazobactam (ZOSYN ) IVPB 3.375 g        3.375 g 100 mL/hr over 30 Minutes Intravenous  Once 12/06/23 1841 12/06/23 2023       Assessment/Plan: s/p * No surgery found * Advance diet Discharge Switch to oral abx  LOS: 3 days    Deward Null III 12/09/2023

## 2023-12-09 NOTE — Plan of Care (Signed)
  Problem: Education: Goal: Knowledge of General Education information will improve Description: Including pain rating scale, medication(s)/side effects and non-pharmacologic comfort measures Outcome: Progressing   Problem: Health Behavior/Discharge Planning: Goal: Ability to manage health-related needs will improve Outcome: Progressing   Problem: Clinical Measurements: Goal: Ability to maintain clinical measurements within normal limits will improve Outcome: Progressing Goal: Will remain free from infection Outcome: Progressing   Problem: Activity: Goal: Risk for activity intolerance will decrease Outcome: Progressing   Problem: Nutrition: Goal: Adequate nutrition will be maintained Outcome: Progressing   Problem: Pain Managment: Goal: General experience of comfort will improve and/or be controlled Outcome: Progressing

## 2023-12-09 NOTE — Plan of Care (Signed)

## 2023-12-10 ENCOUNTER — Ambulatory Visit: Admitting: Obstetrics

## 2023-12-14 NOTE — Discharge Summary (Signed)
 Patient ID: Gina Banks [id number] [birth year] 48 y.o.  Admit date: 12/06/2023 Discharge date: 12/09/2023  Admitting Diagnosis: Acute appendicitis with phlegmon History of laparoscopic Roux-en-Y gastric bypass Hypertension Severe obesity Mild transaminitis  Discharge Diagnosis Perforated appendicitis with abscess  History of laparoscopic Roux-en-Y gastric bypass Hypertension  Consultants IR  HPI: Gina Banks is an 48 y.o. 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 Mikell who states that she started having abdominal pain about 2 weeks ago while visiting her parents down in [place].  She states that the pain was in her upper abdomen mid abdomen and lower abdomen.  It was associate with nausea vomiting and diarrhea.  She went to the ER at [facility] and underwent some basic labs and no imaging and was diagnosed with enteritis and was given some IV fluids and a prescription for Bentyl.  She states that she continued to have persistent intermittent abdominal discomfort and then gradually became mainly focused in the lower abdomen.  It would radiate to her side.  She states that she noticed that her appetite went down.  No dysuria or hematuria.  She would have some trouble starting her urine stream if she were to bear down which would cause abdominal pain.  No melena or hematochezia.  Does not take NSAIDs.  Her nausea and vomiting and diarrhea improved but her abdominal pain persisted so [facility].  Came to the emergency room for further evaluation because of persistent abdominal pain.   She is takes her multivitamin daily but no calcium supplementation.  She has had a cholecystectomy.  She takes medicine for blood pressure.  No blood thinners.  No tobacco or drug use.  Occasional alcohol consumption but very  infrequent.  Procedures None  Hospital Course:  Patient presented as above and was found to have perforated appendicitis with an abscess.  Patient was admitted and placed on antibiotics.  IR was consulted did not feel there was a safe window for intervention.  Patient was managed nonoperatively.  Diet was advanced and tolerated.  She was discharged on antibiotics 8/10 with follow-up in the office.  Please see MD's progress notes for more details. I was not directly involved in this patient's care on day of discharge, therefore portions of the information in this discharge summary was taken from the chart.    Allergies as of 12/09/2023   No Known Allergies      Medication List     TAKE these medications    Alive Womens Gummy Chew Chew 1 tablet by mouth daily.   amLODipine  5 MG tablet Commonly known as: NORVASC  TAKE 1 TABLET BY MOUTH EVERY DAY What changed: when to take this   amoxicillin -clavulanate 875-125 MG tablet Commonly known as: AUGMENTIN  Take 1 tablet by mouth 2 (two) times daily.   carvedilol  25 MG tablet Commonly known as: COREG  TAKE 1 TABLET (25 MG TOTAL) BY MOUTH TWICE A DAY WITH MEALS   dicyclomine 20 MG tablet Commonly known as: BENTYL Take 20 mg by mouth 4 (four) times daily as needed for spasms.   levonorgestrel  20 MCG/24HR IUD Commonly known as: MIRENA  1 each by Intrauterine route once.   oxyCODONE  5 MG immediate release tablet Commonly known as: Oxy IR/ROXICODONE  Take 1 tablet (5 mg total) by mouth every 6 (six) hours as needed for moderate pain (pain score 4-6).  Follow-up Information     Gina Leonor CROME, MD Follow up in 2 week(s).   Specialty: General Surgery Contact information: [address]  [place] [address] [phone]                 Signed: Ozell CHRISTELLA Shaper, [facility] 12/14/2023, 3:36 PM Please see Amion for pager number during day hours 7:00am-4:30pm

## 2024-01-09 ENCOUNTER — Other Ambulatory Visit: Payer: Self-pay | Admitting: Surgery

## 2024-01-09 ENCOUNTER — Ambulatory Visit: Payer: Self-pay | Admitting: Surgery

## 2024-01-09 DIAGNOSIS — K3532 Acute appendicitis with perforation and localized peritonitis, without abscess: Secondary | ICD-10-CM

## 2024-01-14 ENCOUNTER — Encounter: Payer: Self-pay | Admitting: Surgery

## 2024-01-17 ENCOUNTER — Encounter: Payer: Self-pay | Admitting: Radiology

## 2024-01-17 ENCOUNTER — Ambulatory Visit
Admission: RE | Admit: 2024-01-17 | Discharge: 2024-01-17 | Disposition: A | Discharge status: Home / Self Care | Source: Ambulatory Visit | Attending: Surgery | Admitting: Surgery

## 2024-01-17 DIAGNOSIS — K3532 Acute appendicitis with perforation and localized peritonitis, without abscess: Secondary | ICD-10-CM

## 2024-01-17 MED ORDER — IOPAMIDOL (ISOVUE-300) INJECTION 61%
100.0000 mL | Freq: Once | INTRAVENOUS | Status: AC | PRN
  Administered 2024-01-17: 100 mL via INTRAVENOUS

## 2024-02-29 NOTE — Pre-Procedure Instructions (Signed)
 Surgical Instructions   Your procedure is scheduled on March 06, 2024. Report to [facility] Main Entrance A at 9:00 A.M., then check in with the Admitting office. Any questions or running late day of surgery: call [phone]  Questions prior to your surgery date: call [phone], Monday-Friday, 8am-4pm. If you experience any cold or flu symptoms such as cough, fever, chills, shortness of breath, etc. between now and your scheduled surgery, please notify us  at the above number.     Remember:  Do not eat after midnight the night before your surgery   You may drink clear liquids until 8:00 AM the morning of your surgery.   Clear liquids allowed are: Water, Non-Citrus Juices (without pulp), Carbonated Beverages, Clear Tea (no milk, honey, etc.), Black Coffee Only (NO MILK, CREAM OR POWDERED CREAMER of any kind), and Gatorade.    Take these medicines the morning of surgery with A SIP OF WATER: amLODipine  (NORVASC )  carvedilol  (COREG )    One week prior to surgery, STOP taking any Aspirin (unless otherwise instructed by your surgeon) Aleve, Naproxen, Ibuprofen , Motrin , Advil , Goody's, BC's, all herbal medications, fish oil, and non-prescription vitamins.                     Do NOT Smoke (Tobacco/Vaping) for 24 hours prior to your procedure.  If you use a CPAP at night, you may bring your mask/headgear for your overnight stay.   You will be asked to remove any contacts, glasses, piercing's, hearing aid's, dentures/partials prior to surgery. Please bring cases for these items if needed.    Patients discharged the day of surgery will not be allowed to drive home, and someone needs to stay with them for 24 hours.  SURGICAL WAITING ROOM VISITATION Patients may have no more than 2 support people in the waiting area - these visitors may rotate.   Pre-op nurse will coordinate an appropriate time for 1 ADULT support person, who may not rotate, to accompany patient in pre-op.  Children  under the age of 46 must have an adult with them who is not the patient and must remain in the main waiting area with an adult.  If the patient needs to stay at the hospital during part of their recovery, the visitor guidelines for inpatient rooms apply.  Please refer to the [facility] website for the visitor guidelines for any additional information.   If you received a COVID test during your pre-op visit  it is requested that you wear a mask when out in public, stay away from anyone that may not be feeling well and notify your surgeon if you develop symptoms. If you have been in contact with anyone that has tested positive in the last 10 days please notify you surgeon.      Pre-operative CHG Bathing Instructions   You can play a key role in reducing the risk of infection after surgery. Your skin needs to be as free of germs as possible. You can reduce the number of germs on your skin by washing with CHG (chlorhexidine gluconate) soap before surgery. CHG is an antiseptic soap that kills germs and continues to kill germs even after washing.   DO NOT use if you have an allergy to chlorhexidine/CHG or antibacterial soaps. If your skin becomes reddened or irritated, stop using the CHG and notify one of our RNs at [phone].              TAKE A SHOWER THE NIGHT BEFORE SURGERY  Please keep in mind the following:  DO NOT shave, including legs and underarms, 48 hours prior to surgery.   You may shave your face before/day of surgery.  Place clean sheets on your bed the night before surgery Use a clean washcloth (not used since being washed) for shower. DO NOT sleep with pet's night before surgery.  CHG Shower Instructions:  Wash your face and private area with normal soap. If you choose to wash your hair, wash first with your normal shampoo.  After you use shampoo/soap, rinse your hair and body thoroughly to remove shampoo/soap residue.  Turn the water OFF and apply half the bottle of CHG  soap to a CLEAN washcloth.  Apply CHG soap ONLY FROM YOUR NECK DOWN TO YOUR TOES (washing for 3-5 minutes)  DO NOT use CHG soap on face, private areas, open wounds, or sores.  Pay special attention to the area where your surgery is being performed.  If you are having back surgery, having someone wash your back for you may be helpful. Wait 2 minutes after CHG soap is applied, then you may rinse off the CHG soap.  Pat dry with a clean towel  Put on clean pajamas    Additional instructions for the day of surgery: If you choose, you may shower the morning of surgery with an antibacterial soap.  DO NOT APPLY any lotions, deodorants, cologne, or perfumes.   Do not wear jewelry or makeup Do not wear nail polish, gel polish, artificial nails, or any other type of covering on natural nails (fingers and toes) Do not bring valuables to the hospital. [facility] is not responsible for valuables/personal belongings. Put on clean/comfortable clothes.  Please brush your teeth.  Ask your nurse before applying any prescription medications to the skin.

## 2024-03-03 ENCOUNTER — Encounter (HOSPITAL_COMMUNITY)
Admission: RE | Admit: 2024-03-03 | Discharge: 2024-03-03 | Disposition: A | Discharge status: Home / Self Care | Source: Ambulatory Visit | Attending: Surgery | Admitting: Surgery

## 2024-03-03 ENCOUNTER — Encounter (HOSPITAL_COMMUNITY): Payer: Self-pay

## 2024-03-03 ENCOUNTER — Other Ambulatory Visit: Payer: Self-pay

## 2024-03-03 VITALS — BP 135/90 | HR 80 | Temp 98.7°F | Resp 17 | Ht 67.5 in | Wt 287.0 lb

## 2024-03-03 DIAGNOSIS — I2510 Atherosclerotic heart disease of native coronary artery without angina pectoris: Secondary | ICD-10-CM | POA: Insufficient documentation

## 2024-03-03 DIAGNOSIS — Z01818 Encounter for other preprocedural examination: Secondary | ICD-10-CM | POA: Diagnosis present

## 2024-03-03 HISTORY — DX: Other complications of anesthesia, initial encounter: T88.59XA

## 2024-03-03 HISTORY — DX: Anxiety disorder, unspecified: F41.9

## 2024-03-03 HISTORY — DX: Anemia, unspecified: D64.9

## 2024-03-03 HISTORY — DX: Headache, unspecified: R51.9

## 2024-03-03 LAB — BASIC METABOLIC PANEL WITH GFR
Anion gap: 8 (ref 5–15)
BUN: 8 mg/dL (ref 6–20)
CO2: 28 mmol/L (ref 22–32)
Calcium: 9.2 mg/dL (ref 8.9–10.3)
Chloride: 101 mmol/L (ref 98–111)
Creatinine, Ser: 0.67 mg/dL (ref 0.44–1.00)
GFR, Estimated: >60 mL/min (ref >60)
Glucose, Bld: 104 mg/dL — ABNORMAL HIGH (ref 70–99)
Potassium: 4.5 mmol/L (ref 3.5–5.1)
Sodium: 137 mmol/L (ref 135–145)

## 2024-03-03 LAB — CBC
HCT: 38.7 % (ref 36.0–46.0)
Hemoglobin: 12.2 g/dL (ref 12.0–15.0)
MCH: 27.4 pg (ref 26.0–34.0)
MCHC: 31.5 g/dL (ref 30.0–36.0)
MCV: 87 fL (ref 80.0–100.0)
Platelets: 276 K/uL (ref 150–400)
RBC: 4.45 MIL/uL (ref 3.87–5.11)
RDW: 14.9 % (ref 11.5–15.5)
WBC: 3.7 K/uL — ABNORMAL LOW (ref 4.0–10.5)
nRBC: 0 % (ref 0.0–0.2)

## 2024-03-03 NOTE — Progress Notes (Signed)
 PCP - Dr. [facility] Cardiologist - Denies  PPM/ICD - Denies Device Orders - n/a Rep Notified - n/a  Chest x-ray - n/a EKG - 03/03/2024 Stress Test - Denies ECHO - Denies Cardiac Cath - Denies  Sleep Study - Denies CPAP - n/a  No DM  Last dose of GLP1 agonist- n/a GLP1 instructions: n/a  Blood Thinner Instructions: n/a Aspirin Instructions: n/a  ERAS Protcol - Clear liquids until 0800 morning of surgery PRE-SURGERY Ensure or G2- n/a  COVID TEST- n/a   Anesthesia review: No  Patient denies shortness of breath, fever, cough and chest pain at PAT appointment. Pt denies any respiratory illness/infection in the last two months.   All instructions explained to the patient, with a verbal understanding of the material. Patient agrees to go over the instructions while at home for a better understanding. Patient also instructed to self quarantine after being tested for COVID-19. The opportunity to ask questions was provided.

## 2024-03-05 NOTE — Anesthesia Preprocedure Evaluation (Signed)
 Anesthesia Evaluation  Patient identified by MRN, date of birth, ID band Patient awake    Reviewed: Allergy & Precautions, NPO status , Patient's Chart, lab work & pertinent test results  History of Anesthesia Complications (+) history of anesthetic complications (had panic attack when it took too long to go to sleep)  Airway Mallampati: II  TM Distance: >3 FB Neck ROM: Full    Dental  (+) Dental Advisory Given   Pulmonary neg pulmonary ROS   Pulmonary exam normal breath sounds clear to auscultation       Cardiovascular hypertension (amlodipine , carvedilol ), Pt. on medications and Pt. on home beta blockers (-) angina (-) Past MI, (-) Cardiac Stents and (-) CABG (-) dysrhythmias  Rhythm:Regular Rate:Normal     Neuro/Psych neg Seizures PSYCHIATRIC DISORDERS (panic attacks) Anxiety     negative neurological ROS     GI/Hepatic Neg liver ROS,neg GERD  ,,Perforated appendicitis 11/2023, S/p gastric bypass   Endo/Other  neg diabetes  Class 3 obesity  Renal/GU negative Renal ROS     Musculoskeletal   Abdominal  (+) + obese  Peds  Hematology negative hematology ROS (+) Lab Results      Component                Value               Date                      WBC                      3.7 (L)             03/03/2024                HGB                      12.2                03/03/2024                HCT                      38.7                03/03/2024                MCV                      87.0                03/03/2024                PLT                      276                 03/03/2024              Anesthesia Other Findings   Reproductive/Obstetrics                              Anesthesia Physical Anesthesia Plan  ASA: 3  Anesthesia Plan: General   Post-op Pain Management: Tylenol  PO (pre-op)*   Induction: Intravenous  PONV Risk Score and Plan: 3 and Ondansetron , Dexamethasone ,  Midazolam  and Treatment may vary due to age  or medical condition  Airway Management Planned: Oral ETT  Additional Equipment:   Intra-op Plan:   Post-operative Plan: Extubation in OR  Informed Consent: I have reviewed the patients History and Physical, chart, labs and discussed the procedure including the risks, benefits and alternatives for the proposed anesthesia with the patient or authorized representative who has indicated his/her understanding and acceptance.     Dental advisory given  Plan Discussed with: CRNA and Anesthesiologist  Anesthesia Plan Comments: (Risks of general anesthesia discussed including, but not limited to, sore throat, hoarse voice, chipped/damaged teeth, injury to vocal cords, nausea and vomiting, allergic reactions, lung infection, heart attack, stroke, and death. All questions answered. )         Anesthesia Quick Evaluation

## 2024-03-06 ENCOUNTER — Ambulatory Visit (HOSPITAL_COMMUNITY)
Admission: RE | Admit: 2024-03-06 | Discharge: 2024-03-06 | Disposition: A | Discharge status: Home / Self Care | Source: Ambulatory Visit | Attending: Surgery | Admitting: Surgery

## 2024-03-06 ENCOUNTER — Ambulatory Visit (HOSPITAL_BASED_OUTPATIENT_CLINIC_OR_DEPARTMENT_OTHER): Payer: Self-pay | Admitting: Anesthesiology

## 2024-03-06 ENCOUNTER — Other Ambulatory Visit: Payer: Self-pay

## 2024-03-06 ENCOUNTER — Encounter (HOSPITAL_COMMUNITY)
Admission: RE | Disposition: A | Discharge status: Home / Self Care | Payer: Self-pay | Source: Ambulatory Visit | Attending: Surgery

## 2024-03-06 ENCOUNTER — Ambulatory Visit (HOSPITAL_COMMUNITY): Payer: Self-pay | Admitting: Anesthesiology

## 2024-03-06 ENCOUNTER — Encounter (HOSPITAL_COMMUNITY): Payer: Self-pay | Admitting: Surgery

## 2024-03-06 DIAGNOSIS — I10 Essential (primary) hypertension: Secondary | ICD-10-CM

## 2024-03-06 DIAGNOSIS — Z9884 Bariatric surgery status: Secondary | ICD-10-CM | POA: Diagnosis not present

## 2024-03-06 DIAGNOSIS — Z01818 Encounter for other preprocedural examination: Secondary | ICD-10-CM

## 2024-03-06 DIAGNOSIS — E66813 Obesity, class 3: Secondary | ICD-10-CM | POA: Insufficient documentation

## 2024-03-06 DIAGNOSIS — Z6841 Body Mass Index (BMI) 40.0 and over, adult: Secondary | ICD-10-CM | POA: Diagnosis not present

## 2024-03-06 DIAGNOSIS — K37 Unspecified appendicitis: Secondary | ICD-10-CM | POA: Diagnosis present

## 2024-03-06 DIAGNOSIS — K388 Other specified diseases of appendix: Secondary | ICD-10-CM | POA: Diagnosis not present

## 2024-03-06 HISTORY — PX: LAPAROSCOPIC APPENDECTOMY: SHX408

## 2024-03-06 LAB — POCT PREGNANCY, URINE: Preg Test, Ur: NEGATIVE

## 2024-03-06 SURGERY — APPENDECTOMY, LAPAROSCOPIC
Anesthesia: General | Site: Abdomen

## 2024-03-06 MED ORDER — DEXMEDETOMIDINE HCL IN NACL 80 MCG/20ML IV SOLN
INTRAVENOUS | Status: DC | PRN
  Administered 2024-03-06: 12 ug via INTRAVENOUS

## 2024-03-06 MED ORDER — ORAL CARE MOUTH RINSE: 15.0000 mL | Freq: Once | OROMUCOSAL | Status: AC

## 2024-03-06 MED ORDER — MIDAZOLAM HCL (PF) 2 MG/2ML IJ SOLN
INTRAMUSCULAR | Status: DC | PRN
  Administered 2024-03-06: 2 mg via INTRAVENOUS

## 2024-03-06 MED ORDER — ONDANSETRON HCL 4 MG/2ML IJ SOLN
INTRAMUSCULAR | Status: AC
Start: 2024-03-06 — End: 2024-03-06
  Filled 2024-03-06: qty 4

## 2024-03-06 MED ORDER — PHENYLEPHRINE HCL-NACL 20-0.9 MG/250ML-% IV SOLN
INTRAVENOUS | Status: DC | PRN
  Administered 2024-03-06: 30 ug/min via INTRAVENOUS

## 2024-03-06 MED ORDER — HYDROCODONE-ACETAMINOPHEN 5-325 MG PO TABS: 1.0000 | ORAL_TABLET | Freq: Four times a day (QID) | ORAL | 0 refills | Status: AC | PRN

## 2024-03-06 MED ORDER — 0.9 % SODIUM CHLORIDE (POUR BTL) OPTIME
TOPICAL | Status: DC | PRN
Start: 2024-03-06 — End: 2024-03-06
  Administered 2024-03-06: 1000 mL

## 2024-03-06 MED ORDER — LIDOCAINE 2% (20 MG/ML) 5 ML SYRINGE
INTRAMUSCULAR | Status: AC
  Filled 2024-03-06: qty 10

## 2024-03-06 MED ORDER — PROPOFOL 10 MG/ML IV BOLUS
INTRAVENOUS | Status: DC | PRN
  Administered 2024-03-06: 200 mg via INTRAVENOUS

## 2024-03-06 MED ORDER — PROPOFOL 10 MG/ML IV BOLUS
INTRAVENOUS | Status: AC
Start: 2024-03-06 — End: 2024-03-06
  Filled 2024-03-06: qty 20

## 2024-03-06 MED ORDER — DEXAMETHASONE SOD PHOSPHATE PF 10 MG/ML IJ SOLN
INTRAMUSCULAR | Status: DC | PRN
  Administered 2024-03-06: 4 mg via INTRAVENOUS

## 2024-03-06 MED ORDER — CEFAZOLIN SODIUM-DEXTROSE 3-4 GM/150ML-% IV SOLN
3.0000 g | INTRAVENOUS | Status: AC
  Administered 2024-03-06: 3 g via INTRAVENOUS
  Filled 2024-03-06: qty 150

## 2024-03-06 MED ORDER — CELECOXIB 200 MG PO CAPS
200.0000 mg | ORAL_CAPSULE | ORAL | Status: AC
  Administered 2024-03-06: 200 mg via ORAL
  Filled 2024-03-06: qty 1

## 2024-03-06 MED ORDER — BUPIVACAINE-EPINEPHRINE (PF) 0.25% -1:200000 IJ SOLN
INTRAMUSCULAR | Status: AC
  Filled 2024-03-06: qty 30

## 2024-03-06 MED ORDER — OXYCODONE HCL 5 MG/5ML PO SOLN: 5.0000 mg | Freq: Once | ORAL | Status: DC | PRN

## 2024-03-06 MED ORDER — OXYCODONE HCL 5 MG/5ML PO SOLN
ORAL | Status: AC
  Filled 2024-03-06: qty 5

## 2024-03-06 MED ORDER — ROCURONIUM BROMIDE 10 MG/ML (PF) SYRINGE
PREFILLED_SYRINGE | INTRAVENOUS | Status: AC
  Filled 2024-03-06: qty 30

## 2024-03-06 MED ORDER — MIDAZOLAM HCL 2 MG/2ML IJ SOLN
INTRAMUSCULAR | Status: AC
Start: 2024-03-06 — End: 2024-03-06
  Filled 2024-03-06: qty 2

## 2024-03-06 MED ORDER — FENTANYL CITRATE (PF) 250 MCG/5ML IJ SOLN
INTRAMUSCULAR | Status: DC | PRN
  Administered 2024-03-06: 100 ug via INTRAVENOUS

## 2024-03-06 MED ORDER — ACETAMINOPHEN 500 MG PO TABS
1000.0000 mg | ORAL_TABLET | ORAL | Status: AC
  Administered 2024-03-06: 1000 mg via ORAL
  Filled 2024-03-06: qty 2

## 2024-03-06 MED ORDER — LIDOCAINE 2% (20 MG/ML) 5 ML SYRINGE
INTRAMUSCULAR | Status: DC | PRN
  Administered 2024-03-06: 100 mg via INTRAVENOUS

## 2024-03-06 MED ORDER — SUGAMMADEX SODIUM 200 MG/2ML IV SOLN
INTRAVENOUS | Status: DC | PRN
  Administered 2024-03-06: 400 mg via INTRAVENOUS

## 2024-03-06 MED ORDER — FENTANYL CITRATE (PF) 250 MCG/5ML IJ SOLN
INTRAMUSCULAR | Status: AC
  Filled 2024-03-06: qty 5

## 2024-03-06 MED ORDER — FENTANYL CITRATE (PF) 100 MCG/2ML IJ SOLN: 25.0000 ug | INTRAMUSCULAR | Status: DC | PRN

## 2024-03-06 MED ORDER — METRONIDAZOLE 500 MG/100ML IV SOLN
500.0000 mg | INTRAVENOUS | Status: AC
  Administered 2024-03-06: 500 mg via INTRAVENOUS
  Filled 2024-03-06: qty 100

## 2024-03-06 MED ORDER — ROCURONIUM BROMIDE 10 MG/ML (PF) SYRINGE
PREFILLED_SYRINGE | INTRAVENOUS | Status: DC | PRN
  Administered 2024-03-06: 20 mg via INTRAVENOUS
  Administered 2024-03-06: 60 mg via INTRAVENOUS

## 2024-03-06 MED ORDER — OXYCODONE HCL 5 MG PO TABS: 5.0000 mg | ORAL_TABLET | Freq: Once | ORAL | Status: DC | PRN

## 2024-03-06 MED ORDER — BUPIVACAINE-EPINEPHRINE 0.25% -1:200000 IJ SOLN
INTRAMUSCULAR | Status: DC | PRN
  Administered 2024-03-06: 30 mL

## 2024-03-06 MED ORDER — LACTATED RINGERS IV SOLN: INTRAVENOUS | Status: DC

## 2024-03-06 MED ORDER — PHENYLEPHRINE 80 MCG/ML (10ML) SYRINGE FOR IV PUSH (FOR BLOOD PRESSURE SUPPORT)
PREFILLED_SYRINGE | INTRAVENOUS | Status: AC
  Filled 2024-03-06: qty 30

## 2024-03-06 MED ORDER — ONDANSETRON HCL 4 MG/2ML IJ SOLN
INTRAMUSCULAR | Status: DC | PRN
  Administered 2024-03-06: 4 mg via INTRAVENOUS

## 2024-03-06 MED ORDER — CHLORHEXIDINE GLUCONATE 0.12 % MT SOLN
15.0000 mL | Freq: Once | OROMUCOSAL | Status: AC
  Administered 2024-03-06: 15 mL via OROMUCOSAL

## 2024-03-06 MED ORDER — AMISULPRIDE (ANTIEMETIC) 5 MG/2ML IV SOLN
10.0000 mg | Freq: Once | INTRAVENOUS | Status: DC | PRN
Start: 2024-03-06 — End: 2024-03-06

## 2024-03-06 SURGICAL SUPPLY — 39 items
BAG COUNTER SPONGE SURGICOUNT (BAG) ×1 IMPLANT
BLADE CLIPPER SURG (BLADE) IMPLANT
CANISTER SUCTION 3000ML PPV (SUCTIONS) ×1 IMPLANT
CHLORAPREP W/TINT 26 (MISCELLANEOUS) ×1 IMPLANT
CLIP APPLIE 5 13 M/L LIGAMAX5 (MISCELLANEOUS) IMPLANT
COVER SURGICAL LIGHT HANDLE (MISCELLANEOUS) ×1 IMPLANT
DERMABOND ADVANCED .7 DNX12 (GAUZE/BANDAGES/DRESSINGS) ×1 IMPLANT
ELECTRODE REM PT RTRN 9FT ADLT (ELECTROSURGICAL) ×1 IMPLANT
ENDOLOOP SUT PDS II 0 18 (SUTURE) IMPLANT
GLOVE BIOGEL PI IND STRL 6 (GLOVE) ×1 IMPLANT
GLOVE BIOGEL PI MICRO STRL 5.5 (GLOVE) ×1 IMPLANT
GOWN STRL REUS W/ TWL LRG LVL3 (GOWN DISPOSABLE) ×3 IMPLANT
IRRIGATION SUCT STRKRFLW 2 WTP (MISCELLANEOUS) IMPLANT
KIT BASIN OR (CUSTOM PROCEDURE TRAY) ×1 IMPLANT
KIT TURNOVER KIT B (KITS) ×1 IMPLANT
NDL INSUFFLATION 14GA 120MM (NEEDLE) IMPLANT
NEEDLE INSUFFLATION 14GA 120MM (NEEDLE) ×1 IMPLANT
PAD ARMBOARD POSITIONER FOAM (MISCELLANEOUS) ×2 IMPLANT
PENCIL BUTTON HOLSTER BLD 10FT (ELECTRODE) ×1 IMPLANT
RELOAD STAPLE 45 3.6 BLU REG (STAPLE) IMPLANT
SCISSORS LAP 5X35 DISP (ENDOMECHANICALS) IMPLANT
SET TUBE SMOKE EVAC HIGH FLOW (TUBING) ×1 IMPLANT
SHEARS HARMONIC 36 ACE (MISCELLANEOUS) IMPLANT
SLEEVE Z-THREAD 5X100MM (TROCAR) ×1 IMPLANT
SOLN 0.9% NACL POUR BTL 1000ML (IV SOLUTION) ×1 IMPLANT
SOLN STERILE WATER BTL 1000 ML (IV SOLUTION) ×1 IMPLANT
STAPLER POWER ECHELON 45 WIDE (STAPLE) IMPLANT
SUT MNCRL AB 4-0 PS2 18 (SUTURE) ×1 IMPLANT
SUT VICRYL 0 UR6 27IN ABS (SUTURE) IMPLANT
SYSTEM BAG RETRIEVAL 10MM (BASKET) ×1 IMPLANT
TOWEL GREEN STERILE (TOWEL DISPOSABLE) ×1 IMPLANT
TOWEL GREEN STERILE FF (TOWEL DISPOSABLE) ×1 IMPLANT
TRAY FOLEY W/BAG SLVR 14FR (SET/KITS/TRAYS/PACK) IMPLANT
TRAY LAPAROSCOPIC MC (CUSTOM PROCEDURE TRAY) ×1 IMPLANT
TROCAR 11X100 Z THREAD (TROCAR) IMPLANT
TROCAR BALLN 12MMX100 BLUNT (TROCAR) ×1 IMPLANT
TROCAR Z THREAD OPTICAL 12X100 (TROCAR) IMPLANT
TROCAR Z-THREAD OPTICAL 5X100M (TROCAR) ×1 IMPLANT
WARMER LAPAROSCOPE (MISCELLANEOUS) ×1 IMPLANT

## 2024-03-06 NOTE — Transfer of Care (Signed)
 Immediate Anesthesia Transfer of Care Note  Patient: Gina Banks  Procedure(s) Performed: APPENDECTOMY, LAPAROSCOPIC (Abdomen)  Patient Location: PACU  Anesthesia Type:General  Level of Consciousness: drowsy and patient cooperative  Airway & Oxygen Therapy: Patient Spontanous Breathing  Post-op Assessment: Report given to RN, Post -op Vital signs reviewed and stable, and Patient moving all extremities X 4  Post vital signs: Reviewed and stable  Last Vitals:  Vitals Value Taken Time  BP Stable before leaving   Temp    Pulse 73 03/06/24 11:55  Resp 13 03/06/24 11:55  SpO2 98 % 03/06/24 11:55  Vitals shown include unfiled device data.  Last Pain:  Vitals:   03/06/24 0905  TempSrc:   PainSc: 0-No pain         Complications: No notable events documented.

## 2024-03-06 NOTE — Discharge Instructions (Addendum)
 [facility] DISCHARGE INSTRUCTIONS  Activity No heavy lifting greater than 15 pounds for 4 weeks after surgery. Ok to shower in 24 hours, but do not bathe or submerge incisions underwater. Do not drive while taking narcotic pain medication. You may drive when you are no longer taking prescription pain medication, you can comfortably wear a seatbelt, and you can safely maneuver your car and apply brakes.  Wound Care Your incisions are covered with skin glue called Dermabond. This will peel off on its own over time. You may shower and allow warm soapy water to run over your incisions. Gently pat dry. Do not submerge your incision underwater until cleared by your surgeon. Monitor your incision for any new redness, tenderness, or drainage. Many patients will experience some swelling and bruising at the incisions.  Ice packs will help.  Swelling and bruising can take several days to resolve.   Medications A  prescription for pain medication may be given to you upon discharge.  Take your pain medication as prescribed, if needed.  If narcotic pain medicine is not needed, then you may take acetaminophen  (Tylenol ) or ibuprofen  (Advil ) as needed. It is common to experience some constipation if taking pain medication after surgery.  Increasing fluid intake and taking a stool softener (such as Colace) will usually help or prevent this problem from occurring.  A mild laxative (Milk of Magnesia or Miralax) should be taken according to package directions if there are no bowel movements after 48 hours. Take your usually prescribed medications unless otherwise directed. If you need a refill on your pain medication, please contact your pharmacy.  They will contact our office to request authorization. Prescriptions will not be filled after 5 pm or on weekends.  When to Call Us : Fever greater than 100.5 New redness, drainage, or swelling at incision site Severe pain, nausea, or  vomiting Persistent bleeding from incisions  Follow-up You have an appointment scheduled with Dr. Dasie on April 01, 2024 at 9:30am. This will be at the [facility] office at [address], [place], [place]. Please arrive at least 15 minutes prior to your scheduled appointment time.  IF YOU HAVE DISABILITY OR FAMILY LEAVE FORMS, YOU MUST BRING THEM TO THE OFFICE FOR PROCESSING.   DO NOT GIVE THEM TO YOUR DOCTOR.  The clinic staff is available to answer your questions during regular business hours.  Please don't hesitate to call and ask to speak to one of the nurses for clinical concerns.  If you have a medical emergency, go to the nearest emergency room or call 911.  A surgeon from [facility] is always on call at the hospital  [address], [address], [place], [place]  [address] ?  [address] [address], [place], [place]   [address] [phone] ? Toll Free: [phone] ? FAX [phone] Web site: [email or web address]      Managing Your Pain After Surgery Without Opioids    Thank you for participating in our program to help patients manage their pain after surgery without opioids. This is part of our effort to provide you with the best care possible, without exposing you or your family to the risk that opioids pose.  What pain can I expect after surgery? You can expect to have some pain after surgery. This is normal. The pain is typically worse the day after surgery, and quickly begins to get better. Many studies have found that many patients are able to manage their pain  after surgery with Over-the-Counter (OTC) medications such as Tylenol  and Motrin . If you have a condition that does not allow you to take Tylenol  or Motrin , notify your surgical team.  How will I manage my pain? The best strategy for controlling your pain after surgery is around the clock pain control with Tylenol  (acetaminophen ) and Motrin  (ibuprofen  or Advil ).  Alternating these medications with each other allows you to maximize your pain control. In addition to Tylenol  and Motrin , you can use heating pads or ice packs on your incisions to help reduce your pain.  How will I alternate your regular strength over-the-counter pain medication? You will take a dose of pain medication every three hours. Start by taking 650 mg of Tylenol  (2 pills of 325 mg) 3 hours later take 600 mg of Motrin  (3 pills of 200 mg) 3 hours after taking the Motrin  take 650 mg of Tylenol  3 hours after that take 600 mg of Motrin .   - 1 -  See example - if your first dose of Tylenol  is at 12:00 PM   12:00 PM Tylenol  650 mg (2 pills of 325 mg)  3:00 PM Motrin  600 mg (3 pills of 200 mg)  6:00 PM Tylenol  650 mg (2 pills of 325 mg)  9:00 PM Motrin  600 mg (3 pills of 200 mg)  Continue alternating every 3 hours   We recommend that you follow this schedule around-the-clock for at least 3 days after surgery, or until you feel that it is no longer needed. Use the table on the last page of this handout to keep track of the medications you are taking. Important: Do not take more than 3000mg  of Tylenol  or 3200mg  of Motrin  in a 24-hour period. Do not take ibuprofen /Motrin  if you have a history of bleeding stomach ulcers, severe kidney disease, &/or actively taking a blood thinner  What if I still have pain? If you have pain that is not controlled with the over-the-counter pain medications (Tylenol  and Motrin  or Advil ) you might have what we call "breakthrough" pain. You will receive a prescription for a small amount of an opioid pain medication such as Oxycodone , Tramadol, or Tylenol  with Codeine. Use these opioid pills in the first 24 hours after surgery if you have breakthrough pain. Do not take more than 1 pill every 4-6 hours.  If you still have uncontrolled pain after using all opioid pills, don't hesitate to call our staff using the number provided. We will help make sure you are  managing your pain in the best way possible, and if necessary, we can provide a prescription for additional pain medication.   Day 1    Time  Name of Medication Number of pills taken  Amount of Acetaminophen   Pain Level   Comments  AM PM       AM PM       AM PM       AM PM       AM PM       AM PM       AM PM       AM PM       Total Daily amount of Acetaminophen  Do not take more than  3,000 mg per day      Day 2    Time  Name of Medication Number of pills taken  Amount of Acetaminophen   Pain Level   Comments  AM PM       AM PM  AM PM       AM PM       AM PM       AM PM       AM PM       AM PM       Total Daily amount of Acetaminophen  Do not take more than  3,000 mg per day      Day 3    Time  Name of Medication Number of pills taken  Amount of Acetaminophen   Pain Level   Comments  AM PM       AM PM       AM PM       AM PM         AM PM       AM PM       AM PM       AM PM       Total Daily amount of Acetaminophen  Do not take more than  3,000 mg per day      Day 4    Time  Name of Medication Number of pills taken  Amount of Acetaminophen   Pain Level   Comments  AM PM       AM PM       AM PM       AM PM       AM PM       AM PM       AM PM       AM PM       Total Daily amount of Acetaminophen  Do not take more than  3,000 mg per day      Day 5    Time  Name of Medication Number of pills taken  Amount of Acetaminophen   Pain Level   Comments  AM PM       AM PM       AM PM       AM PM       AM PM       AM PM       AM PM       AM PM       Total Daily amount of Acetaminophen  Do not take more than  3,000 mg per day      Day 6    Time  Name of Medication Number of pills taken  Amount of Acetaminophen   Pain Level  Comments  AM PM       AM PM       AM PM       AM PM       AM PM       AM PM       AM PM       AM PM       Total Daily amount of Acetaminophen  Do not take more than  3,000 mg per day       Day 7    Time  Name of Medication Number of pills taken  Amount of Acetaminophen   Pain Level   Comments  AM PM       AM PM       AM PM       AM PM       AM PM       AM PM       AM PM       AM PM       Total Daily amount of Acetaminophen   Do not take more than  3,000 mg per day        For additional information about how and where to safely dispose of unused opioid medications - [facility]ankcrew.uy  Disclaimer: This document contains information and/or instructional materials adapted from [facility] for the typical patient with your condition. It does not replace medical advice from your health care provider because your experience may differ from that of the typical patient. Talk to your health care provider if you have any questions about this document, your condition or your treatment plan. Adapted from [facility]

## 2024-03-06 NOTE — Op Note (Signed)
 Date: 03/06/24  Patient: Gina Banks MRN: [id number]  Preoperative Diagnosis: History of perforated appendicitis Postoperative Diagnosis: Same  Procedure: Laparoscopic appendectomy  Surgeon: Leonor Dawn, MD  EBL: Minimal  Anesthesia: General endotracheal  Specimens: Appendix  Indications: [name] is a 48 yo female who presented with perforated appendicitis with a phlegmon in August of this year. She was treated nonoperatively with IV antibiotics. Follow up imaging showed resolution of the previous phlegmon. After a discussion of the risks and benefits of surgery, she agreed to proceed with appendectomy.  Findings: Mild thickening and discoloration of the appendix, consistent with previous appendicitis. Small bowel was normal with no evidence of intussusception and no pathologic lead points.  Procedure details: Informed consent was obtained in the preoperative area prior to the procedure. The patient was brought to the operating room and placed on the table in the supine position. General anesthesia was induced and appropriate lines and drains were placed for intraoperative monitoring. Perioperative antibiotics were administered per SCIP guidelines. The abdomen was prepped and draped in the usual sterile fashion. A pre-procedure timeout was taken verifying patient identity, surgical site and procedure to be performed.  A small infraumbilical skin incision was made, the umbilical stalk was grasped and elevated, and a Veress needle was inserted through the fascia. Intraperitoneal placement was confirmed with the saline drop test, the abdomen was insufflated, and a 5mm Visiport was placed. The abdomen was insufflated and inspected with no evidence of visceral or vascular injury. A 5mm port was placed in the LLQ, and the umbilical port was upsized to a 12mm port. A 5mm suprapubic port was placed under direct visualization. The cecum was identified in the RLQ, and the entire  appendix was visible and freely mobile. The small bowel was run starting at the terminal ileum and working proximally. 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 There was no evidence of small bowel intussusception (which had been incidentally noted on previous CT) and no pathologic lead points were visualized. The appendix was grasped and elevated. There was a thin adhesive band at the tip of the appendix which was divided with Harmonic shears. A mesenteric window was bluntly created at the base of the appendix, and the appendix was divided at the base from the cecum using a 45mm stapler with a blue load. The mesoappendix was then divided with the Harmonic. The specimen was placed in an endocatch bag. The cecal staple line had oozing, and hemostasis was achieved with clip placement. The pelvis was visually inspected and appeared grossly normal, with no adhesions. The ports were removed and the pneumoperitoneum was evacuated. The specimen was extracted via the umbilical port site and sent for routine pathology. The umbilical port site fascia was closed with an 0 Vicryl suture. The skin at all port sites was closed with 4-0 monocryl subcuticular suture. Dermabond was applied.  The patient tolerated the procedure well with no apparent complications. All counts were correct x2 at the end of the procedure. The patient was extubated and taken to PACU in stable condition.  Leonor Dawn, MD 03/06/24 11:52 AM

## 2024-03-06 NOTE — Anesthesia Postprocedure Evaluation (Signed)
 Anesthesia Post Note  Patient: Gina Banks  Procedure(s) Performed: APPENDECTOMY, LAPAROSCOPIC (Abdomen)     Patient location during evaluation: PACU Anesthesia Type: General Level of consciousness: awake Pain management: pain level controlled Vital Signs Assessment: post-procedure vital signs reviewed and stable Respiratory status: spontaneous breathing, nonlabored ventilation and respiratory function stable Cardiovascular status: blood pressure returned to baseline and stable Postop Assessment: no apparent nausea or vomiting Anesthetic complications: no   No notable events documented.  Last Vitals:  Vitals:   03/06/24 1230 03/06/24 1245  BP: 118/80 117/77  Pulse: 66 74  Resp: 13 13  Temp:  36.7 C  SpO2: 97% 98%    Last Pain:  Vitals:   03/06/24 1245  TempSrc:   PainSc: 2                  Delon Aisha Arch

## 2024-03-06 NOTE — Anesthesia Procedure Notes (Signed)
 Procedure Name: Intubation Date/Time: 03/06/2024 10:56 AM  Performed by: Lamar Lucie DASEN, CRNAPre-anesthesia Checklist: Patient identified, Emergency Drugs available, Suction available and Patient being monitored Patient Re-evaluated:Patient Re-evaluated prior to induction Oxygen Delivery Method: Circle system utilized Preoxygenation: Pre-oxygenation with 100% oxygen Induction Type: IV induction Ventilation: Mask ventilation without difficulty Laryngoscope Size: Mac and 3 Grade View: Grade I Tube type: Oral Tube size: 7.0 mm Number of attempts: 1 Airway Equipment and Method: Stylet and Oral airway Placement Confirmation: ETT inserted through vocal cords under direct vision, positive ETCO2 and breath sounds checked- equal and bilateral Secured at: 22 cm Tube secured with: Tape Dental Injury: Teeth and Oropharynx as per pre-operative assessment

## 2024-03-06 NOTE — H&P (Signed)
 Gina Banks is an 48 y.o. female.    HPI: Ms. Gina Banks is a 48 yo female with a history of perforated appendicitis with abscess, for which she was admitted on 8/7. The collection was not amenable to percutaneous drainage, and resolved with antibiotics alone. She has been feeling well since then. She is here today for interval appendectomy.   Past Medical History:  Diagnosis Date   Anemia    Iron Deficiency   Anxiety    Panic Attacks   Complication of anesthesia    Pt will have panic attacks if not put to sleep fast enough   Cyst of breast    Gastric bypass status for obesity    Headache    Herpes    Hx of cholecystectomy    Hypertension    on meds now    Past Surgical History:  Procedure Laterality Date   BREAST CYST EXCISION     BREAST EXCISIONAL BIOPSY Right 1992   CHOLECYSTECTOMY     CYST REMOVAL NECK Right    DILATION AND CURETTAGE OF UTERUS N/A 01/28/2014   Procedure: DILATATION AND CURETTAGE SUCTION WITH CHROMOSOME STUDIES;  Surgeon: Olam Mill, MD;  Location: [facility];  Service: Gynecology;  Laterality: N/A;   Fallopian Tube Cyst     GASTRIC BYPASS  03/2006    Family History  Problem Relation Age of Onset   Diabetes Father    Hypertension Father    Cancer Mother    Heart attack Paternal Grandmother    Social History:  reports that she has never smoked. She has never used smokeless tobacco. She reports current alcohol use. She reports that she does not use drugs.  Allergies: No Known Allergies  Medications Prior to Admission  Medication Sig Dispense Refill   amLODipine  (NORVASC ) 5 MG tablet TAKE 1 TABLET BY MOUTH EVERY DAY 90 tablet 3   carvedilol  (COREG ) 25 MG tablet TAKE 1 TABLET (25 MG TOTAL) BY MOUTH TWICE A DAY WITH MEALS 180 tablet 3   levonorgestrel  (MIRENA ) 20 MCG/24HR IUD 1 each by Intrauterine route once.     amoxicillin -clavulanate (AUGMENTIN ) 875-125 MG tablet Take 1 tablet by mouth 2 (two) times daily. (Patient not taking:  Reported on 02/29/2024) 20 tablet 0   oxyCODONE  (OXY IR/ROXICODONE ) 5 MG immediate release tablet Take 1 tablet (5 mg total) by mouth every 6 (six) hours as needed for moderate pain (pain score 4-6). (Patient not taking: Reported on 02/29/2024) 15 tablet 0    Results for orders placed or performed during the hospital encounter of 03/06/24 (from the past 48 hours)  Pregnancy, urine POC     Status: None   Collection Time: 03/06/24  9:13 AM  Result Value Ref Range   Preg Test, Ur NEGATIVE NEGATIVE    Comment:        THE SENSITIVITY OF THIS METHODOLOGY IS >20 mIU/mL.    No results found.  Review of Systems  Blood pressure 121/69, pulse 80, temperature (!) 97.5 F (36.4 C), temperature source Oral, resp. rate 20, height 5' 7.5 (1.715 m), weight 130.2 kg, last menstrual period 02/25/2024, SpO2 98%. Physical Exam Vitals reviewed.  Constitutional:      General: She is not in acute distress.    Appearance: Normal appearance.  HENT:     Head: Normocephalic and atraumatic.  Pulmonary:     Effort: Pulmonary effort is normal. No respiratory distress.  Abdominal:     General: There is no distension.     Palpations: Abdomen is  soft.     Tenderness: There is no abdominal tenderness.  Skin:    General: Skin is warm and dry.  Neurological:     General: No focal deficit present.     Mental Status: She is alert and oriented to person, place, and time.      Assessment/Plan 48 yo female with a history of perforated appendicitis. Follow up scan shows resolution of the previous abscess. She has been referred to GI for a screening colonoscopy but does not have an appointment, will follow up on this. Proceed to OR for laparoscopic appendectomy. Anticipate discharge home postoperatively.   Leonor LITTIE Dawn, MD 03/06/2024, 10:16 AM

## 2024-03-07 ENCOUNTER — Encounter (HOSPITAL_COMMUNITY): Payer: Self-pay | Admitting: Surgery

## 2024-03-10 LAB — SURGICAL PATHOLOGY

## 2024-05-18 ENCOUNTER — Other Ambulatory Visit: Payer: Self-pay | Admitting: Family Medicine

## 2024-05-18 DIAGNOSIS — I10 Essential (primary) hypertension: Secondary | ICD-10-CM
